# Patient Record
Sex: Female | Born: 1985 | Race: White | Hispanic: No | Marital: Married | Smoking: Never smoker
Health system: Southern US, Community
[De-identification: ages and names within clinical notes are randomized; demographics above are authoritative.]

## PROBLEM LIST (undated history)

## (undated) ENCOUNTER — Inpatient Hospital Stay (HOSPITAL_COMMUNITY): Payer: Self-pay

## (undated) DIAGNOSIS — Z789 Other specified health status: Secondary | ICD-10-CM

## (undated) HISTORY — PX: NO PAST SURGERIES: SHX2092

## (undated) HISTORY — PX: WISDOM TOOTH EXTRACTION: SHX21

---
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
## 2003-01-31 ENCOUNTER — Other Ambulatory Visit: Admission: RE | Admit: 2003-01-31 | Discharge: 2003-01-31 | Payer: Self-pay | Admitting: Family Medicine

## 2003-07-31 ENCOUNTER — Other Ambulatory Visit: Admission: RE | Admit: 2003-07-31 | Discharge: 2003-07-31 | Payer: Self-pay | Admitting: Family Medicine

## 2003-12-05 ENCOUNTER — Other Ambulatory Visit: Admission: RE | Admit: 2003-12-05 | Discharge: 2003-12-05 | Payer: Self-pay | Admitting: Family Medicine

## 2010-11-03 NOTE — L&D Delivery Note (Signed)
Patient was C/C/+2 and pushed for 45 minutes with epidural.   NSVD  female infant, Apgars 9,9, weight 6#14.   The patient had one MLE lacerations repaired with 2-0 vicryl R. Fundus was firm. EBL was expected. Placenta was delivered intact. Vagina was clear.  Baby was vigorous to bedside.  Rogue Rafalski A    

## 2010-12-05 LAB — RPR: RPR: NONREACTIVE

## 2010-12-05 LAB — ABO/RH: RH Type: POSITIVE

## 2011-06-30 LAB — STREP B DNA PROBE: GBS: NEGATIVE

## 2011-07-27 ENCOUNTER — Encounter (HOSPITAL_COMMUNITY): Payer: Self-pay | Admitting: Anesthesiology

## 2011-07-27 ENCOUNTER — Inpatient Hospital Stay (HOSPITAL_COMMUNITY)
Admission: AD | Admit: 2011-07-27 | Discharge: 2011-07-30 | DRG: 373 | Disposition: A | Discharge status: Home / Self Care | Payer: BC Managed Care – PPO | Source: Ambulatory Visit | Attending: Obstetrics and Gynecology | Admitting: Obstetrics and Gynecology

## 2011-07-27 ENCOUNTER — Encounter (HOSPITAL_COMMUNITY): Payer: Self-pay | Admitting: *Deleted

## 2011-07-27 ENCOUNTER — Inpatient Hospital Stay (HOSPITAL_COMMUNITY): Payer: BC Managed Care – PPO | Admitting: Anesthesiology

## 2011-07-27 DIAGNOSIS — Z3480 Encounter for supervision of other normal pregnancy, unspecified trimester: Secondary | ICD-10-CM

## 2011-07-27 HISTORY — DX: Other specified health status: Z78.9

## 2011-07-27 LAB — CBC
HCT: 34.2 % — ABNORMAL LOW (ref 36.0–46.0)
MCHC: 34.8 g/dL (ref 30.0–36.0)
MCV: 86.8 fL (ref 78.0–100.0)
Platelets: 156 10*3/uL (ref 150–400)
RDW: 12.7 % (ref 11.5–15.5)
WBC: 9.4 10*3/uL (ref 4.0–10.5)

## 2011-07-27 LAB — URINALYSIS, ROUTINE W REFLEX MICROSCOPIC
Glucose, UA: NEGATIVE mg/dL
Hgb urine dipstick: NEGATIVE
pH: 6 (ref 5.0–8.0)

## 2011-07-27 LAB — COMPREHENSIVE METABOLIC PANEL
AST: 19 U/L (ref 0–37)
Albumin: 2.9 g/dL — ABNORMAL LOW (ref 3.5–5.2)
BUN: 8 mg/dL (ref 6–23)
Creatinine, Ser: 0.47 mg/dL — ABNORMAL LOW (ref 0.50–1.10)
Potassium: 3.4 mEq/L — ABNORMAL LOW (ref 3.5–5.1)
Total Protein: 6.7 g/dL (ref 6.0–8.3)

## 2011-07-27 LAB — URINE MICROSCOPIC-ADD ON

## 2011-07-27 LAB — POCT FERN TEST: Fern Test: POSITIVE

## 2011-07-27 LAB — RPR: RPR Ser Ql: NONREACTIVE

## 2011-07-27 MED ORDER — DIPHENHYDRAMINE HCL 50 MG/ML IJ SOLN: 12.5000 mg | INTRAMUSCULAR | Status: DC | PRN

## 2011-07-27 MED ORDER — PHENYLEPHRINE 40 MCG/ML (10ML) SYRINGE FOR IV PUSH (FOR BLOOD PRESSURE SUPPORT)
80.0000 ug | PREFILLED_SYRINGE | INTRAVENOUS | Status: DC | PRN
  Filled 2011-07-27: qty 5

## 2011-07-27 MED ORDER — OXYTOCIN BOLUS FROM INFUSION
500.0000 mL | Freq: Once | INTRAVENOUS | Status: DC
  Filled 2011-07-27: qty 500

## 2011-07-27 MED ORDER — LACTATED RINGERS IV SOLN
500.0000 mL | Freq: Once | INTRAVENOUS | Status: AC
  Administered 2011-07-27: 500 mL via INTRAVENOUS

## 2011-07-27 MED ORDER — OXYCODONE-ACETAMINOPHEN 5-325 MG PO TABS: 2.0000 | ORAL_TABLET | ORAL | Status: DC | PRN

## 2011-07-27 MED ORDER — LIDOCAINE HCL 1.5 % IJ SOLN
INTRAMUSCULAR | Status: DC | PRN
  Administered 2011-07-27 (×2): 4 mL via EPIDURAL

## 2011-07-27 MED ORDER — EPHEDRINE 5 MG/ML INJ
10.0000 mg | INTRAVENOUS | Status: DC | PRN
  Filled 2011-07-27: qty 4

## 2011-07-27 MED ORDER — IBUPROFEN 600 MG PO TABS
600.0000 mg | ORAL_TABLET | Freq: Four times a day (QID) | ORAL | Status: DC | PRN
  Administered 2011-07-28: 600 mg via ORAL
  Filled 2011-07-27: qty 1

## 2011-07-27 MED ORDER — ONDANSETRON HCL 4 MG/2ML IJ SOLN: 4.0000 mg | Freq: Four times a day (QID) | INTRAMUSCULAR | Status: DC | PRN

## 2011-07-27 MED ORDER — OXYTOCIN 20 UNITS IN LACTATED RINGERS INFUSION - SIMPLE
1.0000 m[IU]/min | INTRAVENOUS | Status: DC
  Administered 2011-07-27: 2 m[IU]/min via INTRAVENOUS
  Filled 2011-07-27: qty 1000

## 2011-07-27 MED ORDER — ACETAMINOPHEN 325 MG PO TABS: 650.0000 mg | ORAL_TABLET | ORAL | Status: DC | PRN

## 2011-07-27 MED ORDER — PHENYLEPHRINE 40 MCG/ML (10ML) SYRINGE FOR IV PUSH (FOR BLOOD PRESSURE SUPPORT)
80.0000 ug | PREFILLED_SYRINGE | INTRAVENOUS | Status: DC | PRN
  Filled 2011-07-27 (×2): qty 5

## 2011-07-27 MED ORDER — FENTANYL 2.5 MCG/ML BUPIVACAINE 1/10 % EPIDURAL INFUSION (WH - ANES)
14.0000 mL/h | INTRAMUSCULAR | Status: DC
  Administered 2011-07-27 (×3): 14 mL/h via EPIDURAL
  Filled 2011-07-27 (×4): qty 60

## 2011-07-27 MED ORDER — OXYTOCIN 20 UNITS IN LACTATED RINGERS INFUSION - SIMPLE
125.0000 mL/h | Freq: Once | INTRAVENOUS | Status: AC
  Administered 2011-07-28: 125 mL/h via INTRAVENOUS

## 2011-07-27 MED ORDER — FLEET ENEMA 7-19 GM/118ML RE ENEM: 1.0000 | ENEMA | RECTAL | Status: DC | PRN

## 2011-07-27 MED ORDER — LIDOCAINE HCL (PF) 1 % IJ SOLN
30.0000 mL | INTRAMUSCULAR | Status: DC | PRN
  Filled 2011-07-27 (×2): qty 30

## 2011-07-27 MED ORDER — LACTATED RINGERS IV SOLN: 500.0000 mL | INTRAVENOUS | Status: DC | PRN

## 2011-07-27 MED ORDER — FENTANYL 2.5 MCG/ML BUPIVACAINE 1/10 % EPIDURAL INFUSION (WH - ANES)
INTRAMUSCULAR | Status: DC | PRN
  Administered 2011-07-27: 14 mL/h via EPIDURAL

## 2011-07-27 MED ORDER — LACTATED RINGERS IV SOLN
INTRAVENOUS | Status: DC
  Administered 2011-07-27 (×3): via INTRAVENOUS

## 2011-07-27 MED ORDER — CITRIC ACID-SODIUM CITRATE 334-500 MG/5ML PO SOLN: 30.0000 mL | ORAL | Status: DC | PRN

## 2011-07-27 MED ORDER — EPHEDRINE 5 MG/ML INJ
10.0000 mg | INTRAVENOUS | Status: DC | PRN
  Filled 2011-07-27 (×2): qty 4

## 2011-07-27 MED ORDER — TERBUTALINE SULFATE 1 MG/ML IJ SOLN: 0.2500 mg | Freq: Once | INTRAMUSCULAR | Status: AC | PRN

## 2011-07-27 NOTE — ED Provider Notes (Signed)
History   Pt presents today c/o ctx that have gradually worsened through the night. She reports GFM. She denies vag dc, bleeding, fever, or any other sx at this time.  Chief Complaint  Patient presents with  . Contractions   HPI  OB History    Grav Para Term Preterm Abortions TAB SAB Ect Mult Living   1               Past Medical History  Diagnosis Date  . No pertinent past medical history     Past Surgical History  Procedure Date  . No past surgeries     No family history on file.  History  Substance Use Topics  . Smoking status: Never Smoker   . Smokeless tobacco: Never Used  . Alcohol Use: No    Allergies: No Known Allergies  Prescriptions prior to admission  Medication Sig Dispense Refill  . multivitamin (BARIATRIC VIT W/EXTRA C) CHEW Chew 2 tablets by mouth daily.          Review of Systems  Constitutional: Negative for fever.  Eyes: Negative for blurred vision.  Cardiovascular: Negative for chest pain.  Gastrointestinal: Positive for abdominal pain. Negative for nausea, vomiting, diarrhea and constipation.  Genitourinary: Negative for dysuria, urgency, frequency and hematuria.  Neurological: Negative for dizziness and headaches.  Psychiatric/Behavioral: Negative for depression and suicidal ideas.   Physical Exam   Blood pressure 129/89, pulse 92, temperature 97.9 F (36.6 C), temperature source Oral, resp. rate 20, height 5\' 3"  (1.6 m), weight 157 lb 6.4 oz (71.396 kg).  Physical Exam  Constitutional: She is oriented to person, place, and time. She appears well-developed and well-nourished. No distress.  HENT:  Head: Normocephalic and atraumatic.  Eyes: EOM are normal. Pupils are equal, round, and reactive to light.  GI: Soft. She exhibits no distension. There is no tenderness. There is no rebound and no guarding.  Genitourinary:       Cervix per nurse 1-2/90/-1.  Neurological: She is alert and oriented to person, place, and time.  Skin: Skin is  warm and dry. She is not diaphoretic.  Psychiatric: She has a normal mood and affect. Her behavior is normal. Judgment and thought content normal.    MAU Course  Procedures  CBC, CMET, UA ordered.  Care of pt transferred to Eveline Keto, FNP.  Assessment and Plan PIH labs back, normal, B/P's still sl elevated. Pt continues to contract, is uncomfortable, reactive strip. Cx unchanged, + ferning on slide. Dr Henderson Cloud notified by RN  Eveline Keto, NP    RICE,EASTON 07/27/2011, 7:47 AM   Henrietta Hoover, PA 07/27/11 7829  [place]. Desarie Feild 07/27/11 [phone]

## 2011-07-27 NOTE — H&P (Signed)
25 y.o. 31  G1P0 comes in c/o labor.  Otherwise has good fetal movement and no bleeding.  SROM in MAU.  No s/s preeclampsia.  Past Medical History  Diagnosis Date  . No pertinent past medical history     Past Surgical History  Procedure Date  . No past surgeries     OB History    Grav Para Term Preterm Abortions TAB SAB Ect Mult Living   1              # Outc Date GA Lbr Len/2nd Wgt Sex Del Anes PTL Lv   1 CUR               History   Social History  . Marital Status: Married    Spouse Name: N/A    Number of Children: N/A  . Years of Education: N/A   Occupational History  . Not on file.   Social History Main Topics  . Smoking status: Never Smoker   . Smokeless tobacco: Never Used  . Alcohol Use: No  . Drug Use: No  . Sexually Active: Yes   Other Topics Concern  . Not on file   Social History Narrative  . No narrative on file   Review of patient's allergies indicates no known allergies.   Prenatal Course:  Uncomplicated.  Filed Vitals:   07/27/11 0847  BP: 137/87  Pulse: 88  Temp:   Resp:      Lungs/Cor:  NAD Abdomen:  soft, gravid Ex:  no cords, erythema SVE:  1./90/-1 grossly SROM per nurse. FHTs:  140s, good STV, NST R Toco:  q3-6   A/P   SROM/Labor.    GBS neg.  Katrece Roediger A

## 2011-07-27 NOTE — Progress Notes (Signed)
G1 at 39.1wks. Cramping and contractions since Sat pm. Ctxs have gradually gotten closer and stronger.

## 2011-07-27 NOTE — Progress Notes (Signed)
Notified of SVE, elevated b/ps PIH lab results and positive SROM. Orders to admit pt to L&D

## 2011-07-27 NOTE — Anesthesia Procedure Notes (Signed)
Epidural Patient location during procedure: OB Start time: 07/27/2011 11:59 AM  Staffing Anesthesiologist: Kahealani Yankovich A. Performed by: anesthesiologist   Preanesthetic Checklist Completed: patient identified, site marked, surgical consent, pre-op evaluation, timeout performed, IV checked, risks and benefits discussed and monitors and equipment checked  Epidural Patient position: sitting Prep: site prepped and draped and DuraPrep Patient monitoring: continuous pulse ox and blood pressure Approach: midline Injection technique: LOR air  Needle:  Needle type: Tuohy  Needle gauge: 17 G Needle length: 9 cm Needle insertion depth: 5 cm cm Catheter type: closed end flexible Catheter size: 19 Gauge Catheter at skin depth: 10 cm Test dose: negative and 1.5% lidocaine  Assessment Events: blood not aspirated, injection not painful, no injection resistance, negative IV test and no paresthesia  Additional Notes Patient is more comfortable after epidural dosed. Please see RN's note for documentation of vital signs and FHR which are stable.

## 2011-07-27 NOTE — Anesthesia Preprocedure Evaluation (Signed)
Anesthesia Evaluation  Name, MR# and DOB Patient awake  General Assessment Comment  Reviewed: Allergy & Precautions, H&P , Patient's Chart, lab work & pertinent test results  Airway Mallampati: III TM Distance: >3 FB Neck ROM: full    Dental No notable dental hx. (+) Teeth Intact   Pulmonary  clear to auscultation  pulmonary exam normalPulmonary Exam Normal breath sounds clear to auscultation none    Cardiovascular regular Normal    Neuro/Psych Negative Neurological ROS  Negative Psych ROS  GI/Hepatic/Renal negative GI ROS  negative Liver ROS  negative Renal ROS        Endo/Other  Negative Endocrine ROS (+)      Abdominal   Musculoskeletal   Hematology negative hematology ROS (+)   Peds  Reproductive/Obstetrics (+) Pregnancy    Anesthesia Other Findings             Anesthesia Physical Anesthesia Plan  ASA: II  Anesthesia Plan: Epidural   Post-op Pain Management:    Induction:   Airway Management Planned:   Additional Equipment:   Intra-op Plan:   Post-operative Plan:   Informed Consent: I have reviewed the patients History and Physical, chart, labs and discussed the procedure including the risks, benefits and alternatives for the proposed anesthesia with the patient or authorized representative who has indicated his/her understanding and acceptance.     Plan Discussed with: Anesthesiologist  Anesthesia Plan Comments:         Anesthesia Quick Evaluation  

## 2011-07-27 NOTE — ED Notes (Signed)
Henrietta Hoover PA notified of pt's admission for labor ck. And elevated B/Ps . Will see pt

## 2011-07-28 ENCOUNTER — Encounter (HOSPITAL_COMMUNITY): Payer: Self-pay | Admitting: *Deleted

## 2011-07-28 LAB — CBC
HCT: 28.6 % — ABNORMAL LOW (ref 36.0–46.0)
Hemoglobin: 10 g/dL — ABNORMAL LOW (ref 12.0–15.0)
MCH: 30.6 pg (ref 26.0–34.0)
MCHC: 35 g/dL (ref 30.0–36.0)
MCV: 87.5 fL (ref 78.0–100.0)
RBC: 3.27 MIL/uL — ABNORMAL LOW (ref 3.87–5.11)

## 2011-07-28 LAB — ABO/RH: ABO/RH(D): O POS

## 2011-07-28 MED ORDER — ONDANSETRON HCL 4 MG/2ML IJ SOLN: 4.0000 mg | INTRAMUSCULAR | Status: DC | PRN

## 2011-07-28 MED ORDER — BENZOCAINE-MENTHOL 20-0.5 % EX AERO
1.0000 "application " | INHALATION_SPRAY | CUTANEOUS | Status: DC | PRN
  Administered 2011-07-28: 1 via TOPICAL

## 2011-07-28 MED ORDER — MEASLES, MUMPS & RUBELLA VAC ~~LOC~~ INJ
0.5000 mL | INJECTION | Freq: Once | SUBCUTANEOUS | Status: DC
Start: 2011-07-29 — End: 2011-07-29
  Filled 2011-07-28: qty 0.5

## 2011-07-28 MED ORDER — ZOLPIDEM TARTRATE 5 MG PO TABS: 5.0000 mg | ORAL_TABLET | Freq: Every evening | ORAL | Status: DC | PRN

## 2011-07-28 MED ORDER — SODIUM CHLORIDE 0.9 % IJ SOLN: 3.0000 mL | INTRAMUSCULAR | Status: DC | PRN

## 2011-07-28 MED ORDER — SODIUM CHLORIDE 0.9 % IV SOLN: 250.0000 mL | INTRAVENOUS | Status: DC

## 2011-07-28 MED ORDER — MEASLES, MUMPS & RUBELLA VAC ~~LOC~~ INJ: 0.5000 mL | INJECTION | Freq: Once | SUBCUTANEOUS | Status: DC

## 2011-07-28 MED ORDER — MAGNESIUM HYDROXIDE 400 MG/5ML PO SUSP: 30.0000 mL | ORAL | Status: DC | PRN

## 2011-07-28 MED ORDER — METHYLERGONOVINE MALEATE 0.2 MG PO TABS: 0.2000 mg | ORAL_TABLET | ORAL | Status: DC | PRN

## 2011-07-28 MED ORDER — OXYCODONE-ACETAMINOPHEN 5-325 MG PO TABS: 1.0000 | ORAL_TABLET | ORAL | Status: DC | PRN

## 2011-07-28 MED ORDER — ONDANSETRON HCL 4 MG PO TABS: 4.0000 mg | ORAL_TABLET | ORAL | Status: DC | PRN

## 2011-07-28 MED ORDER — PRENATAL PLUS 27-1 MG PO TABS: 1.0000 | ORAL_TABLET | Freq: Every day | ORAL | Status: DC

## 2011-07-28 MED ORDER — METHYLERGONOVINE MALEATE 0.2 MG/ML IJ SOLN: 0.2000 mg | INTRAMUSCULAR | Status: DC | PRN

## 2011-07-28 MED ORDER — DIPHENHYDRAMINE HCL 25 MG PO CAPS: 25.0000 mg | ORAL_CAPSULE | Freq: Four times a day (QID) | ORAL | Status: DC | PRN

## 2011-07-28 MED ORDER — SIMETHICONE 80 MG PO CHEW: 80.0000 mg | CHEWABLE_TABLET | ORAL | Status: DC | PRN

## 2011-07-28 MED ORDER — WITCH HAZEL-GLYCERIN EX PADS
1.0000 "application " | MEDICATED_PAD | CUTANEOUS | Status: DC | PRN
  Administered 2011-07-28: 1 via TOPICAL

## 2011-07-28 MED ORDER — TETANUS-DIPHTH-ACELL PERTUSSIS 5-2.5-18.5 LF-MCG/0.5 IM SUSP: 0.5000 mL | Freq: Once | INTRAMUSCULAR | Status: DC

## 2011-07-28 MED ORDER — WITCH HAZEL-GLYCERIN EX PADS: 1.0000 "application " | MEDICATED_PAD | CUTANEOUS | Status: DC | PRN

## 2011-07-28 MED ORDER — DIBUCAINE 1 % RE OINT
1.0000 "application " | TOPICAL_OINTMENT | RECTAL | Status: DC | PRN
  Administered 2011-07-28: 1 via RECTAL
  Filled 2011-07-28: qty 28

## 2011-07-28 MED ORDER — PRENATAL PLUS 27-1 MG PO TABS
1.0000 | ORAL_TABLET | Freq: Every day | ORAL | Status: DC
  Administered 2011-07-28 – 2011-07-30 (×3): 1 via ORAL
  Filled 2011-07-28 (×3): qty 1

## 2011-07-28 MED ORDER — OXYTOCIN 20 UNITS IN LACTATED RINGERS INFUSION - SIMPLE: 125.0000 mL/h | INTRAVENOUS | Status: DC | PRN

## 2011-07-28 MED ORDER — SENNOSIDES-DOCUSATE SODIUM 8.6-50 MG PO TABS: 2.0000 | ORAL_TABLET | Freq: Every day | ORAL | Status: DC

## 2011-07-28 MED ORDER — LANOLIN HYDROUS EX OINT
TOPICAL_OINTMENT | CUTANEOUS | Status: DC | PRN
Start: 2011-07-28 — End: 2011-07-28

## 2011-07-28 MED ORDER — FERROUS SULFATE 325 (65 FE) MG PO TABS
325.0000 mg | ORAL_TABLET | Freq: Two times a day (BID) | ORAL | Status: DC
  Administered 2011-07-28: 325 mg via ORAL

## 2011-07-28 MED ORDER — IBUPROFEN 800 MG PO TABS
800.0000 mg | ORAL_TABLET | Freq: Three times a day (TID) | ORAL | Status: DC
  Administered 2011-07-28 – 2011-07-30 (×6): 800 mg via ORAL
  Filled 2011-07-28 (×6): qty 1

## 2011-07-28 MED ORDER — FERROUS SULFATE 325 (65 FE) MG PO TABS
325.0000 mg | ORAL_TABLET | Freq: Two times a day (BID) | ORAL | Status: DC
  Administered 2011-07-28 – 2011-07-30 (×4): 325 mg via ORAL
  Filled 2011-07-28 (×5): qty 1

## 2011-07-28 MED ORDER — SENNOSIDES-DOCUSATE SODIUM 8.6-50 MG PO TABS
2.0000 | ORAL_TABLET | Freq: Every day | ORAL | Status: DC
  Administered 2011-07-28 – 2011-07-29 (×2): 2 via ORAL

## 2011-07-28 MED ORDER — LANOLIN HYDROUS EX OINT: TOPICAL_OINTMENT | CUTANEOUS | Status: DC | PRN

## 2011-07-28 MED ORDER — BENZOCAINE-MENTHOL 20-0.5 % EX AERO: 1.0000 "application " | INHALATION_SPRAY | CUTANEOUS | Status: DC | PRN

## 2011-07-28 MED ORDER — SODIUM CHLORIDE 0.9 % IJ SOLN: 3.0000 mL | Freq: Two times a day (BID) | INTRAMUSCULAR | Status: DC

## 2011-07-28 MED ORDER — IBUPROFEN 800 MG PO TABS: 800.0000 mg | ORAL_TABLET | Freq: Three times a day (TID) | ORAL | Status: DC

## 2011-07-28 MED ORDER — DIBUCAINE 1 % RE OINT: 1.0000 "application " | TOPICAL_OINTMENT | RECTAL | Status: DC | PRN

## 2011-07-28 MED ORDER — BENZOCAINE-MENTHOL 20-0.5 % EX AERO
INHALATION_SPRAY | CUTANEOUS | Status: AC
  Administered 2011-07-28: 1 via TOPICAL
  Filled 2011-07-28: qty 56

## 2011-07-28 NOTE — Plan of Care (Signed)
Problem: Discharge Progression Outcomes Goal: Complications resolved/controlled Outcome: Completed/Met Date Met:  07/28/11 hemmorroid tucks and dibucaine issued needs sitz bath tomorrow Goal: Discharge plan in place and appropriate Outcome: Completed/Met Date Met:  07/28/11 Wants early d/c

## 2011-07-28 NOTE — Progress Notes (Signed)
Post Partum Day 1 Subjective: no complaints, up ad lib, voiding, tolerating PO and + flatus  Objective: Blood pressure 131/84, pulse 77, temperature 98.1 F (36.7 C), temperature source Oral, resp. rate 18, height 5\' 3"  (1.6 m), weight 71.396 kg (157 lb 6.4 oz), SpO2 98.00%, unknown if currently breastfeeding.  Physical Exam:  General: alert, cooperative, appears stated age and no distress Lochia: appropriate Uterine Fundus: firm DVT Evaluation: No evidence of DVT seen on physical exam.   Basename 07/28/11 0508 07/27/11 0732  HGB 10.0* 11.9*  HCT 28.6* 34.2*    Assessment/Plan: Routine Care Breastfeeding   LOS: 1 day   Christia Domke H. 07/28/2011, 9:35 AM

## 2011-07-28 NOTE — Anesthesia Postprocedure Evaluation (Signed)
  Anesthesia Post-op Note  Patient: Sport and exercise psychologist  Procedure(s) Performed: * No procedures listed *  Patient Location: PACU and Mother/Baby  Anesthesia Type: Epidural  Level of Consciousness: awake, alert , oriented, patient cooperative and responds to stimulation  Airway and Oxygen Therapy: Patient Spontanous Breathing  Post-op Pain: none  Post-op Assessment: Post-op Vital signs reviewed, Patient's Cardiovascular Status Stable, Respiratory Function Stable, Patent Airway, No signs of Nausea or vomiting and Pain level controlled  Post-op Vital Signs: Reviewed and stable  Complications: No apparent anesthesia complications

## 2011-07-28 NOTE — Progress Notes (Signed)
Patient was C/C/+2 and pushed for 45 minutes with epidural.   NSVD  female infant, Apgars 9,9, weight 6#14.   The patient had one MLE lacerations repaired with 2-0 vicryl R. Fundus was firm. EBL was expected. Placenta was delivered intact. Vagina was clear.  Baby was vigorous to bedside.  Natasha Duke A

## 2011-07-29 LAB — CBC
HCT: 26.4 % — ABNORMAL LOW (ref 36.0–46.0)
MCH: 30.6 pg (ref 26.0–34.0)
MCHC: 35.2 g/dL (ref 30.0–36.0)
MCV: 86.8 fL (ref 78.0–100.0)
Platelets: 131 10*3/uL — ABNORMAL LOW (ref 150–400)
RDW: 13 % (ref 11.5–15.5)
WBC: 12.3 10*3/uL — ABNORMAL HIGH (ref 4.0–10.5)

## 2011-07-29 NOTE — Progress Notes (Signed)
Post Partum Day 1 Subjective: no complaints  Objective: Blood pressure 114/72, pulse 108, temperature 99.1 F (37.3 C), temperature source Oral, resp. rate 18, height 5\' 3"  (1.6 m), weight 71.396 kg (157 lb 6.4 oz), SpO2 97.00%, unknown if currently breastfeeding.  Physical Exam:  General: alert Lochia: appropriate Uterine Fundus: firm   Basename 07/29/11 0030 07/28/11 0508  HGB 9.3* 10.0*  HCT 26.4* 28.6*    Assessment/Plan: Plan for discharge tomorrow, baby not nursing well, pt. declined early discharge.   LOS: 2 days   Adonai Selsor D 07/29/2011, 8:40 AM

## 2011-07-30 NOTE — Progress Notes (Signed)
PPD#2 Pt without c/o. VVSAF IMP/ doing well. PLAN/ d/c

## 2011-07-30 NOTE — Discharge Summary (Signed)
  Pt was admitted with SROM.  Labor course was uneventful.  She had a SVD over second degree ML tear. PP course was uneventful. She was discharged to home on day 2.

## 2011-08-03 ENCOUNTER — Inpatient Hospital Stay (HOSPITAL_COMMUNITY): Admission: AD | Admit: 2011-08-03 | Payer: Self-pay | Source: Ambulatory Visit | Admitting: Obstetrics and Gynecology

## 2011-08-05 ENCOUNTER — Ambulatory Visit (HOSPITAL_COMMUNITY)
Admit: 2011-08-05 | Discharge: 2011-08-05 | Disposition: A | Discharge status: Home / Self Care | Payer: BC Managed Care – PPO | Attending: Obstetrics and Gynecology | Admitting: Obstetrics and Gynecology

## 2011-08-05 NOTE — Progress Notes (Signed)
Infant Lactation Consultation Outpatient Visit Note  Patient Name: Natasha Duke Date of Birth: 12-28-1985 Birth Weight:  6-13 Gestational Age at Delivery: Gestational Age: <None> Type of Delivery: vaginal  Breastfeeding History Frequency of Breastfeeding: every 3 hrs day, 3-4 hrs at night Length of Feeding: 30 mins Voids: 8 times/24 hrs Stools: 8 times/24 hrs  Supplementing / Method: Pumping: none at this time  Type of Pump:   Frequency:  Volume:    Comments: Baby has been solely breastfeeding, no supplements, Mom uses a nipple shield to assist with a proper latch.  Breasts are full, and large, nipples erect, baby opens widely but quickly slips too shallow onto nipple.  Using the 24 mm nipple shield helps baby maintain a deeper latch.  Baby has gained 14 oz. In 6 days!  Mom is reassured that use of the nipple shield is helping baby transfer milk, so not to be rushed to try to wean.  Tips given on how to wean off of nipple shield.     Consultation Evaluation:  Initial Feeding Assessment: Pre-feed Weight:3270 Post-feed [id number]:8119 Amount Transferred:47 Comments: left breast with nipple shield  Additional Feeding Assessment: Pre-feed Weight:3317 Post-feed JYNWGN:5621 Amount Transferred:30 Comments: changed to smaller nipple shield (20mm)   Total Breast milk Transferred this Visit: 77 ml Total Supplement Given: 0  Additional Interventions: Gave Mom a smaller size nipple shield (20mm) as the larger looked too large which can encourage nipple pinching if not positioned properly on nipple..  Baby latched deeply and nursed well with 20 mm nipple shield. Mom to call us prn for any questions.  Follow-Up Mom to continue to nurse on cue, and call us for any questions she may have.     Judee Clara 08/05/2011, 10:46 AM

## 2013-06-10 ENCOUNTER — Other Ambulatory Visit (HOSPITAL_COMMUNITY): Payer: Self-pay | Admitting: Obstetrics and Gynecology

## 2013-06-10 DIAGNOSIS — O2620 Pregnancy care for patient with recurrent pregnancy loss, unspecified trimester: Secondary | ICD-10-CM

## 2013-06-13 ENCOUNTER — Ambulatory Visit (HOSPITAL_COMMUNITY)
Admission: RE | Admit: 2013-06-13 | Discharge: 2013-06-13 | Disposition: A | Discharge status: Home / Self Care | Payer: BC Managed Care – PPO | Source: Ambulatory Visit | Attending: Obstetrics and Gynecology | Admitting: Obstetrics and Gynecology

## 2013-06-13 DIAGNOSIS — O2620 Pregnancy care for patient with recurrent pregnancy loss, unspecified trimester: Secondary | ICD-10-CM

## 2013-06-13 DIAGNOSIS — N96 Recurrent pregnancy loss: Secondary | ICD-10-CM | POA: Insufficient documentation

## 2013-06-13 DIAGNOSIS — N854 Malposition of uterus: Secondary | ICD-10-CM | POA: Insufficient documentation

## 2013-06-13 MED ORDER — IOHEXOL 300 MG/ML  SOLN
20.0000 mL | Freq: Once | INTRAMUSCULAR | Status: AC | PRN
  Administered 2013-06-13: 18 mL

## 2013-07-31 ENCOUNTER — Inpatient Hospital Stay (HOSPITAL_COMMUNITY)
Admission: AD | Admit: 2013-07-31 | Discharge: 2013-07-31 | Disposition: A | Discharge status: Home / Self Care | Payer: BC Managed Care – PPO | Source: Ambulatory Visit | Attending: Obstetrics and Gynecology | Admitting: Obstetrics and Gynecology

## 2013-07-31 ENCOUNTER — Encounter (HOSPITAL_COMMUNITY): Payer: Self-pay | Admitting: *Deleted

## 2013-07-31 ENCOUNTER — Inpatient Hospital Stay (HOSPITAL_COMMUNITY): Payer: BC Managed Care – PPO

## 2013-07-31 DIAGNOSIS — O209 Hemorrhage in early pregnancy, unspecified: Secondary | ICD-10-CM

## 2013-07-31 DIAGNOSIS — O4690 Antepartum hemorrhage, unspecified, unspecified trimester: Secondary | ICD-10-CM

## 2013-07-31 DIAGNOSIS — O26859 Spotting complicating pregnancy, unspecified trimester: Secondary | ICD-10-CM | POA: Insufficient documentation

## 2013-07-31 LAB — CBC
HCT: 35.1 % — ABNORMAL LOW (ref 36.0–46.0)
MCV: 80.7 fL (ref 78.0–100.0)
Platelets: 199 10*3/uL (ref 150–400)
RBC: 4.35 MIL/uL (ref 3.87–5.11)
WBC: 5.8 10*3/uL (ref 4.0–10.5)

## 2013-07-31 LAB — WET PREP, GENITAL
Clue Cells Wet Prep HPF POC: NONE SEEN
Trich, Wet Prep: NONE SEEN

## 2013-07-31 LAB — URINALYSIS, ROUTINE W REFLEX MICROSCOPIC
Glucose, UA: NEGATIVE mg/dL
Leukocytes, UA: NEGATIVE
Protein, ur: NEGATIVE mg/dL
pH: 6.5 (ref 5.0–8.0)

## 2013-07-31 LAB — HCG, QUANTITATIVE, PREGNANCY: hCG, Beta Chain, Quant, S: 51420 m[IU]/mL — ABNORMAL HIGH (ref <5)

## 2013-07-31 LAB — OB RESULTS CONSOLE GC/CHLAMYDIA
Chlamydia: NEGATIVE
Gonorrhea: NEGATIVE

## 2013-07-31 LAB — ABO/RH: ABO/RH(D): O POS

## 2013-07-31 LAB — POCT PREGNANCY, URINE: Preg Test, Ur: POSITIVE — AB

## 2013-07-31 MED ORDER — DOXYLAMINE-PYRIDOXINE 10-10 MG PO TBEC: 2.0000 | DELAYED_RELEASE_TABLET | Freq: Every day | ORAL | Status: DC

## 2013-07-31 NOTE — MAU Provider Note (Signed)
History     CSN: [id number]  Arrival date and time: 07/31/13 1101   None     Chief Complaint  Patient presents with  . Vaginal Bleeding   HPI This is a 27 y.o. female at 7.2wks by LMP who presents with c/o spotting this morning. No heavy flow. Slight cramping. History is remarkable for SVD in 2012 with reported 3 SABs since January of this year. Did not seek medical care for any of them. All were under 10 weeks. Was tested for recurrent SAB. Had HSG a week before conception.  RN Note:  Pt presents with complaints of bright red vaginal bleeding since this morning, states it is only when she wipes. She does have a history of 3 SAB in the past.       OB History   Grav Para Term Preterm Abortions TAB SAB Ect Mult Living   5 1 1  0 3 0 3 0 0 1      Past Medical History  Diagnosis Date  . No pertinent past medical history     Past Surgical History  Procedure Laterality Date  . No past surgeries      No family history on file.  History  Substance Use Topics  . Smoking status: Never Smoker   . Smokeless tobacco: Never Used  . Alcohol Use: No    Allergies: No Known Allergies  Prescriptions prior to admission  Medication Sig Dispense Refill  . multivitamin (BARIATRIC VIT W/EXTRA C) CHEW Chew 2 tablets by mouth daily.          Review of Systems  Constitutional: Negative for fever, chills and malaise/fatigue.  Gastrointestinal: Positive for nausea and abdominal pain (mild cramps, spotting). Negative for vomiting, diarrhea and constipation.  Neurological: Negative for dizziness.   Physical Exam   Blood pressure 155/97, pulse 113, temperature 98 F (36.7 C), temperature source Oral, resp. rate 16, height 5\' 2"  (1.575 m), weight 53.978 kg (119 lb), last menstrual period 06/07/2013.  Physical Exam  Constitutional: She is oriented to person, place, and time. She appears well-developed and well-nourished. No distress (tearful).  Cardiovascular: Normal rate.    Respiratory: Effort normal.  GI: Soft. She exhibits no distension and no mass. There is no tenderness. There is no rebound and no guarding.  Genitourinary: Uterus normal. Vaginal discharge (creamy white, no colored discharge, cervix long and closed) found.  Musculoskeletal: Normal range of motion.  Neurological: She is alert and oriented to person, place, and time.  Skin: Skin is warm and dry.  Psychiatric: She has a normal mood and affect.    MAU Course  Procedures  MDM Results for orders placed during the hospital encounter of 07/31/13 (from the past 72 hour(s))  URINALYSIS, ROUTINE W REFLEX MICROSCOPIC     Status: Abnormal   Collection Time    07/31/13 11:05 AM      Result Value Range   Color, Urine YELLOW  YELLOW   APPearance CLEAR  CLEAR   Specific Gravity, Urine >1.030 (*) 1.005 - 1.030   pH 6.5  5.0 - 8.0   Glucose, UA NEGATIVE  NEGATIVE mg/dL   Hgb urine dipstick NEGATIVE  NEGATIVE   Bilirubin Urine NEGATIVE  NEGATIVE   Ketones, ur NEGATIVE  NEGATIVE mg/dL   Protein, ur NEGATIVE  NEGATIVE mg/dL   Urobilinogen, UA 0.2  0.0 - 1.0 mg/dL   Nitrite NEGATIVE  NEGATIVE   Leukocytes, UA NEGATIVE  NEGATIVE   Comment: MICROSCOPIC NOT DONE ON URINES WITH NEGATIVE PROTEIN,  BLOOD, LEUKOCYTES, NITRITE, OR GLUCOSE <1000 mg/dL.  POCT PREGNANCY, URINE     Status: Abnormal   Collection Time    07/31/13 11:18 AM      Result Value Range   Preg Test, Ur POSITIVE (*) NEGATIVE   Comment:            THE SENSITIVITY OF THIS     METHODOLOGY IS >24 mIU/mL  WET PREP, GENITAL     Status: Abnormal   Collection Time    07/31/13 11:38 AM      Result Value Range   Yeast Wet Prep HPF POC NONE SEEN  NONE SEEN   Trich, Wet Prep NONE SEEN  NONE SEEN   Clue Cells Wet Prep HPF POC NONE SEEN  NONE SEEN   WBC, Wet Prep HPF POC FEW (*) NONE SEEN   Comment: FEW BACTERIA SEEN  HCG, QUANTITATIVE, PREGNANCY     Status: Abnormal   Collection Time    07/31/13 11:43 AM      Result Value Range   hCG,  Beta Chain, Quant, S 51420 (*) <5 mIU/mL   Comment:              GEST. AGE      CONC.  (mIU/mL)       <=1 WEEK        5 - 50         2 WEEKS       50 - 500         3 WEEKS       100 - 10,000         4 WEEKS     1,000 - 30,000         5 WEEKS     3,500 - 115,000       6-8 WEEKS     12,000 - 270,000        12 WEEKS     15,000 - 220,000                FEMALE AND NON-PREGNANT FEMALE:         LESS THAN 5 mIU/mL  CBC     Status: Abnormal   Collection Time    07/31/13 11:43 AM      Result Value Range   WBC 5.8  4.0 - 10.5 K/uL   RBC 4.35  3.87 - 5.11 MIL/uL   Hemoglobin 12.8  12.0 - 15.0 g/dL   HCT 40.9 (*) 81.1 - 91.4 %   MCV 80.7  78.0 - 100.0 fL   MCH 29.4  26.0 - 34.0 pg   MCHC 36.5 (*) 30.0 - 36.0 g/dL   RDW 78.2  95.6 - 21.3 %   Platelets 199  150 - 400 K/uL  ABO/RH     Status: None   Collection Time    07/31/13 11:43 AM      Result Value Range   ABO/RH(D) O POS     US Ob Transvaginal  07/31/2013   CLINICAL DATA:  Cramping, spotting, three prior spontaneous abortions  EXAM: OBSTETRIC <14 WK [place] AND TRANSVAGINAL OB US  TECHNIQUE: Both transabdominal and transvaginal ultrasound examinations were performed for complete evaluation of the gestation as well as the maternal uterus, adnexal regions, and pelvic cul-de-sac. Transvaginal technique was performed to assess early pregnancy.  COMPARISON:  None  FINDINGS: Intrauterine gestational sac: Visualized/normal in shape.  Yolk sac:  Present  Embryo:  Present  Cardiac Activity: Present  Heart Rate:  135  bpm  CRL:   8.5  mm   6 w 6 d                  [place] EDC: 03/20/2014  Maternal uterus/adnexae:  Small subchorionic hemorrhage, 24 x 9 x 16 mm.  Uterus otherwise unremarkable.  Right ovary normal size and morphology 2.1 x 1.3 x 1.5 cm.  Left ovary normal size and morphology, 3.6 x 1.6 x 2.5 cm.  No adnexal masses or free pelvic fluid.  IMPRESSION: Single live early intrauterine gestational measured at 6 weeks 6 days EGA by crown-rump length.  Small  subchorionic hemorrhage.   Electronically Signed   By: Ulyses Southward M.D.   On: 07/31/2013 12:45    Assessment and Plan  A:  SIUP at 6.6 weeks      First trimester spotting      Small subchorionic hemorrhage  P:  Discussed with Dr Arlyce Dice      Discussed with pt.       Pelvic rest      Follow up with office as scheduled early October  Affie Gasner 07/31/2013, 11:16 AM

## 2013-07-31 NOTE — MAU Note (Signed)
Pt presents with complaints of bright red vaginal bleeding since this morning, states it is only when she wipes. She does have a history of 3 SAB in the past.

## 2013-07-31 NOTE — MAU Note (Signed)
Pt states she had some mild spotting this morning upon wiping after using the bathroom. No bleeding at this time.

## 2013-08-16 ENCOUNTER — Encounter (HOSPITAL_COMMUNITY): Payer: Self-pay | Admitting: *Deleted

## 2013-08-16 ENCOUNTER — Inpatient Hospital Stay (HOSPITAL_COMMUNITY)
Admission: AD | Admit: 2013-08-16 | Discharge: 2013-08-16 | Disposition: A | Discharge status: Home / Self Care | Payer: BC Managed Care – PPO | Source: Ambulatory Visit | Attending: Obstetrics and Gynecology | Admitting: Obstetrics and Gynecology

## 2013-08-16 DIAGNOSIS — K117 Disturbances of salivary secretion: Secondary | ICD-10-CM | POA: Insufficient documentation

## 2013-08-16 DIAGNOSIS — E860 Dehydration: Secondary | ICD-10-CM | POA: Insufficient documentation

## 2013-08-16 DIAGNOSIS — O210 Mild hyperemesis gravidarum: Secondary | ICD-10-CM | POA: Insufficient documentation

## 2013-08-16 DIAGNOSIS — O9989 Other specified diseases and conditions complicating pregnancy, childbirth and the puerperium: Secondary | ICD-10-CM | POA: Insufficient documentation

## 2013-08-16 DIAGNOSIS — O219 Vomiting of pregnancy, unspecified: Secondary | ICD-10-CM

## 2013-08-16 HISTORY — DX: Other specified health status: Z78.9

## 2013-08-16 LAB — URINALYSIS, ROUTINE W REFLEX MICROSCOPIC
Glucose, UA: NEGATIVE mg/dL
Hgb urine dipstick: NEGATIVE
Nitrite: NEGATIVE
Specific Gravity, Urine: >1.03 — ABNORMAL HIGH (ref 1.005–1.030)
Urobilinogen, UA: 0.2 mg/dL (ref 0.0–1.0)

## 2013-08-16 MED ORDER — LACTATED RINGERS IV BOLUS (SEPSIS)
1000.0000 mL | Freq: Once | INTRAVENOUS | Status: AC
  Administered 2013-08-16: 1000 mL via INTRAVENOUS

## 2013-08-16 MED ORDER — GLYCOPYRROLATE 2 MG PO TABS: 2.0000 mg | ORAL_TABLET | Freq: Three times a day (TID) | ORAL | Status: DC

## 2013-08-16 MED ORDER — PROMETHAZINE HCL 25 MG/ML IJ SOLN
25.0000 mg | Freq: Once | INTRAVENOUS | Status: DC
  Filled 2013-08-16: qty 1

## 2013-08-16 MED ORDER — ONDANSETRON 8 MG/NS 50 ML IVPB
8.0000 mg | Freq: Once | INTRAVENOUS | Status: AC
  Administered 2013-08-16: 8 mg via INTRAVENOUS
  Filled 2013-08-16: qty 8

## 2013-08-16 NOTE — MAU Provider Note (Signed)
Chief Complaint: Emesis During Pregnancy  First patient contact at 1915   SUBJECTIVE HPI: Natasha Duke is a 27 y.o. G5P1031 at [redacted]w[redacted]d by LMP who has vomited 4 times today and having ptyalism. Has eaten very little. Has tried Diclegis.  Past Medical History  Diagnosis Date  . No pertinent past medical history   . Medical history non-contributory    OB History  Gravida Para Term Preterm AB SAB TAB Ectopic Multiple Living  5 1 1  0 3 3 0 0 0 1    # Outcome Date GA Lbr Len/2nd Weight Sex Delivery Anes PTL Lv  5 CUR           4 SAB 2014          3 SAB 2014          2 SAB 2014          1 TRM 07/28/11 [redacted]w[redacted]d 27:30 / 00:47 3.11 kg (6 lb 13.7 oz) F SVD EPI  Y     Comments: none     Past Surgical History  Procedure Laterality Date  . Wisdom tooth extraction     History   Social History  . Marital Status: Married    Spouse Name: N/A    Number of Children: N/A  . Years of Education: N/A   Occupational History  . Not on file.   Social History Main Topics  . Smoking status: Never Smoker   . Smokeless tobacco: Never Used  . Alcohol Use: No  . Drug Use: No  . Sexual Activity: Yes   Other Topics Concern  . Not on file   Social History Narrative  . No narrative on file   No current facility-administered medications on file prior to encounter.   Current Outpatient Prescriptions on File Prior to Encounter  Medication Sig Dispense Refill  . Doxylamine-Pyridoxine (DICLEGIS) 10-10 MG TBEC Take 2 tablets by mouth at bedtime.  60 tablet  3  . Prenatal Vit-Fe Fumarate-FA (PRENATAL MULTIVITAMIN) TABS tablet Take 1 tablet by mouth daily at 12 noon.       No Known Allergies  ROS: Pertinent items in HPI  OBJECTIVE Blood pressure 131/85, temperature 98.5 F (36.9 C), temperature source Oral, resp. rate 16, height 5\' 2"  (1.575 m), weight 52.617 kg (116 lb), last menstrual period 06/07/2013. GENERAL: Well-developed, well-nourished female in no acute distress.  HEENT:  Normocephalic HEART: normal rate  RESP: normal effort ABDOMEN: Soft, non-tender, DT FH not heard EXTREMITIES: Nontender, no edema NEURO: Alert and oriented  LAB RESULTS Results for orders placed during the hospital encounter of 08/16/13 (from the past 24 hour(s))  URINALYSIS, ROUTINE W REFLEX MICROSCOPIC     Status: Abnormal   Collection Time    08/16/13  5:40 PM      Result Value Range   Color, Urine YELLOW  YELLOW   APPearance CLEAR  CLEAR   Specific Gravity, Urine >1.030 (*) 1.005 - 1.030   pH 6.0  5.0 - 8.0   Glucose, UA NEGATIVE  NEGATIVE mg/dL   Hgb urine dipstick NEGATIVE  NEGATIVE   Bilirubin Urine NEGATIVE  NEGATIVE   Ketones, ur 40 (*) NEGATIVE mg/dL   Protein, ur NEGATIVE  NEGATIVE mg/dL   Urobilinogen, UA 0.2  0.0 - 1.0 mg/dL   Nitrite NEGATIVE  NEGATIVE   Leukocytes, UA NEGATIVE  NEGATIVE    IMAGING Bedside US: FHR normal  MAU COURSE IV LR 1000 bolus with piggyback Zofran 8 mg> Feeling better and retaining juice.  ASSESSMENT 1. Nausea and vomiting in pregnancy prior to [redacted] weeks gestation   2. Ptyalism   3. Mild dehydration   G5P1031 at [redacted]w[redacted]d  PLAN Discharge home after IV infused.    Medication List    TAKE these medications       Doxylamine-Pyridoxine 10-10 MG Tbec  Commonly known as:  DICLEGIS  Take 2 tablets by mouth at bedtime.     glycopyrrolate 2 MG tablet  Commonly known as:  ROBINUL-FORTE  Take 1 tablet (2 mg total) by mouth 3 (three) times daily.     prenatal multivitamin Tabs tablet  Take 1 tablet by mouth daily at 12 noon.      ASK your doctor about these medications       ondansetron 8 MG tablet  Commonly known as:  ZOFRAN  Take by mouth every 8 (eight) hours as needed for nausea.       Follow-up Information   Follow up with [facility] (Keep your scheduled prenatal appointment)    Contact information:   [address] [place] [address] [phone]       Danae Orleans, CNM 08/16/2013  7:11 PM

## 2013-08-16 NOTE — MAU Note (Signed)
Really hasn't kept anything down all weekend, urine is getting dark. Called dr and was told to come in.

## 2013-08-29 LAB — OB RESULTS CONSOLE HIV ANTIBODY (ROUTINE TESTING): HIV: NONREACTIVE

## 2013-08-29 LAB — OB RESULTS CONSOLE RUBELLA ANTIBODY, IGM: Rubella: IMMUNE

## 2013-08-29 LAB — OB RESULTS CONSOLE RPR: RPR: NONREACTIVE

## 2013-11-03 NOTE — L&D Delivery Note (Signed)
Delivery Note At 7:28 PM a viable and healthy female was delivered via Vaginal, Spontaneous Delivery (Presentation: Right Occiput Posterior).  APGAR: 8, 9; weight pending.   Placenta status: Intact, Spontaneous.  Cord: 3 vessels  Anesthesia: Epidural  Episiotomy: None Lacerations: 2nd degree Suture Repair: 3.0 vicryl 4-0 vicryl Est. Blood Loss (mL): 250 cc  Mom to postpartum.  Baby to Couplet care / Skin to Skin.  Natasha Duke 03/02/2014, 8:02 PM

## 2014-02-27 LAB — OB RESULTS CONSOLE GBS: GBS: POSITIVE

## 2014-02-28 ENCOUNTER — Inpatient Hospital Stay (HOSPITAL_COMMUNITY)
Admission: AD | Admit: 2014-02-28 | Discharge: 2014-02-28 | Disposition: A | Discharge status: Home / Self Care | Payer: BC Managed Care – PPO | Source: Ambulatory Visit | Attending: Obstetrics and Gynecology | Admitting: Obstetrics and Gynecology

## 2014-02-28 ENCOUNTER — Encounter (HOSPITAL_COMMUNITY): Payer: Self-pay | Admitting: *Deleted

## 2014-02-28 DIAGNOSIS — O133 Gestational [pregnancy-induced] hypertension without significant proteinuria, third trimester: Secondary | ICD-10-CM

## 2014-02-28 DIAGNOSIS — O139 Gestational [pregnancy-induced] hypertension without significant proteinuria, unspecified trimester: Secondary | ICD-10-CM

## 2014-02-28 DIAGNOSIS — O479 False labor, unspecified: Secondary | ICD-10-CM | POA: Insufficient documentation

## 2014-02-28 LAB — URIC ACID: Uric Acid, Serum: 4.5 mg/dL (ref 2.4–7.0)

## 2014-02-28 LAB — COMPREHENSIVE METABOLIC PANEL WITH GFR
ALT: 8 U/L (ref 0–35)
AST: 17 U/L (ref 0–37)
Albumin: 2.9 g/dL — ABNORMAL LOW (ref 3.5–5.2)
Alkaline Phosphatase: 117 U/L (ref 39–117)
BUN: 11 mg/dL (ref 6–23)
CO2: 21 meq/L (ref 19–32)
Calcium: 9.5 mg/dL (ref 8.4–10.5)
Chloride: 102 meq/L (ref 96–112)
Creatinine, Ser: 0.5 mg/dL (ref 0.50–1.10)
GFR calc Af Amer: >90 mL/min
GFR calc non Af Amer: >90 mL/min
Glucose, Bld: 73 mg/dL (ref 70–99)
Potassium: 4.1 meq/L (ref 3.7–5.3)
Sodium: 138 meq/L (ref 137–147)
Total Bilirubin: 0.2 mg/dL — ABNORMAL LOW (ref 0.3–1.2)
Total Protein: 6.8 g/dL (ref 6.0–8.3)

## 2014-02-28 LAB — URINALYSIS, ROUTINE W REFLEX MICROSCOPIC
Bilirubin Urine: NEGATIVE
Glucose, UA: NEGATIVE mg/dL
HGB URINE DIPSTICK: NEGATIVE
Ketones, ur: NEGATIVE mg/dL
Leukocytes, UA: NEGATIVE
Nitrite: NEGATIVE
PROTEIN: NEGATIVE mg/dL
Specific Gravity, Urine: >1.03 — ABNORMAL HIGH (ref 1.005–1.030)
UROBILINOGEN UA: 0.2 mg/dL (ref 0.0–1.0)
pH: 6 (ref 5.0–8.0)

## 2014-02-28 LAB — PROTEIN / CREATININE RATIO, URINE
Creatinine, Urine: 113.45 mg/dL
Protein Creatinine Ratio: 0.1 (ref 0.00–0.15)
Total Protein, Urine: 11.5 mg/dL

## 2014-02-28 LAB — CBC
HCT: 33.8 % — ABNORMAL LOW (ref 36.0–46.0)
Hemoglobin: 11.5 g/dL — ABNORMAL LOW (ref 12.0–15.0)
MCH: 27.7 pg (ref 26.0–34.0)
MCHC: 34 g/dL (ref 30.0–36.0)
MCV: 81.4 fL (ref 78.0–100.0)
Platelets: 167 K/uL (ref 150–400)
RBC: 4.15 MIL/uL (ref 3.87–5.11)
RDW: 14.2 % (ref 11.5–15.5)
WBC: 10.2 K/uL (ref 4.0–10.5)

## 2014-02-28 LAB — LACTATE DEHYDROGENASE: LDH: 176 U/L (ref 94–250)

## 2014-02-28 NOTE — MAU Note (Signed)
Pt sent from office for evaluation of elevated bp

## 2014-02-28 NOTE — Progress Notes (Signed)
Pt removed from serial bp.

## 2014-02-28 NOTE — MAU Provider Note (Signed)
History     CSN: [id number]  Arrival date and time: 02/28/14 1534   First Provider Initiated Contact with Patient 02/28/14 1647      Chief Complaint  Patient presents with  . Hypertension   HPI Ms. Natasha Duke is a 28 y.o. [phone] at [id number] who presents to MAU today from the office for further evaluation of elevated BP. The patient denies history of HTN or pre-eclampsia. She denies headache, blurred vision, floaters, LE edema or RUQ pain. The patient states mild pressure in the lower abdomina. She is having occasional braxton-hicks contractions. She reports good fetal movement.   OB History   Grav Para Term Preterm Abortions TAB SAB Ect Mult Living   5 1 1  0 3 0 3 0 0 1      Past Medical History  Diagnosis Date  . No pertinent past medical history   . Medical history non-contributory     Past Surgical History  Procedure Laterality Date  . Wisdom tooth extraction    . No past surgeries      Family History  Problem Relation Age of Onset  . Hypertension Mother   . Diabetes Mother   . Hypertension Father   . Hypertension Maternal Grandmother   . Hypertension Paternal Grandfather     History  Substance Use Topics  . Smoking status: Never Smoker   . Smokeless tobacco: Never Used  . Alcohol Use: No    Allergies: No Known Allergies  No prescriptions prior to admission    Review of Systems  Constitutional: Negative for fever and malaise/fatigue.  Eyes: Negative for blurred vision and double vision.  Gastrointestinal: Positive for nausea. Negative for vomiting, abdominal pain, diarrhea and constipation.  Genitourinary: Negative for dysuria, urgency and frequency.       No vaginal bleeding, discharge, LOF  Neurological: Negative for headaches.   Physical Exam   Blood pressure 132/89, pulse 113, temperature 98.6 F (37 C), temperature source Oral, resp. rate 20, height 5\' 2"  (1.575 m), weight 162 lb (73.483 kg), last menstrual period 06/07/2013, SpO2  96.00%.  Physical Exam  Constitutional: She is oriented to person, place, and time. She appears well-developed and well-nourished. No distress.  HENT:  Head: Normocephalic and atraumatic.  Cardiovascular: Normal rate.   Respiratory: Effort normal.  GI: Soft. She exhibits no distension and no mass. There is no tenderness. There is no rebound and no guarding.  Musculoskeletal: She exhibits no edema.  Neurological: She is alert and oriented to person, place, and time. She has normal reflexes.  No clonus  Skin: Skin is warm and dry. No erythema.  Psychiatric: She has a normal mood and affect.   Results for orders placed during the hospital encounter of 02/28/14 (from the past 24 hour(s))  URINALYSIS, ROUTINE W REFLEX MICROSCOPIC     Status: Abnormal   Collection Time    02/28/14  4:00 PM      Result Value Ref Range   Color, Urine YELLOW  YELLOW   APPearance CLEAR  CLEAR   Specific Gravity, Urine >1.030 (*) 1.005 - 1.030   pH 6.0  5.0 - 8.0   Glucose, UA NEGATIVE  NEGATIVE mg/dL   Hgb urine dipstick NEGATIVE  NEGATIVE   Bilirubin Urine NEGATIVE  NEGATIVE   Ketones, ur NEGATIVE  NEGATIVE mg/dL   Protein, ur NEGATIVE  NEGATIVE mg/dL   Urobilinogen, UA 0.2  0.0 - 1.0 mg/dL   Nitrite NEGATIVE  NEGATIVE   Leukocytes, UA NEGATIVE  NEGATIVE  PROTEIN / CREATININE RATIO, URINE     Status: None   Collection Time    02/28/14  4:00 PM      Result Value Ref Range   Creatinine, Urine 113.45     Total Protein, Urine 11.5     PROTEIN CREATININE RATIO 0.10  0.00 - 0.15  CBC     Status: Abnormal   Collection Time    02/28/14  4:05 PM      Result Value Ref Range   WBC 10.2  4.0 - 10.5 K/uL   RBC 4.15  3.87 - 5.11 MIL/uL   Hemoglobin 11.5 (*) 12.0 - 15.0 g/dL   HCT 96.0 (*) 45.4 - 09.8 %   MCV 81.4  78.0 - 100.0 fL   MCH 27.7  26.0 - 34.0 pg   MCHC 34.0  30.0 - 36.0 g/dL   RDW 11.9  14.7 - 82.9 %   Platelets 167  150 - 400 K/uL  COMPREHENSIVE METABOLIC PANEL     Status: Abnormal    Collection Time    02/28/14  4:05 PM      Result Value Ref Range   Sodium 138  137 - 147 mEq/L   Potassium 4.1  3.7 - 5.3 mEq/L   Chloride 102  96 - 112 mEq/L   CO2 21  19 - 32 mEq/L   Glucose, Bld 73  70 - 99 mg/dL   BUN 11  6 - 23 mg/dL   Creatinine, Ser 5.62  0.50 - 1.10 mg/dL   Calcium 9.5  8.4 - 13.0 mg/dL   Total Protein 6.8  6.0 - 8.3 g/dL   Albumin 2.9 (*) 3.5 - 5.2 g/dL   AST 17  0 - 37 U/L   ALT 8  0 - 35 U/L   Alkaline Phosphatase 117  39 - 117 U/L   Total Bilirubin 0.2 (*) 0.3 - 1.2 mg/dL   GFR calc non Af Amer >90  >90 mL/min   GFR calc Af Amer >90  >90 mL/min  URIC ACID     Status: None   Collection Time    02/28/14  4:05 PM      Result Value Ref Range   Uric Acid, Serum 4.5  2.4 - 7.0 mg/dL  LACTATE DEHYDROGENASE     Status: None   Collection Time    02/28/14  4:05 PM      Result Value Ref Range   LDH 176  94 - 250 U/L   Patient Vitals for the past 24 hrs:  BP Temp Temp src Pulse Resp SpO2 Height Weight  02/28/14 1827 - - - 113 - 96 % - -  02/28/14 1727 132/89 mmHg - - 118 - - - -  02/28/14 1713 138/90 mmHg - - 108 - - - -  02/28/14 1658 139/92 mmHg - - 111 - - - -  02/28/14 1643 142/95 mmHg - - 113 - - - -  02/28/14 1628 141/99 mmHg - - 119 - - - -  02/28/14 1620 144/90 mmHg - - 113 - - - -  02/28/14 1559 152/104 mmHg 98.6 F (37 C) Oral 109 20 - 5\' 2"  (1.575 m) 162 lb (73.483 kg)    MAU Course  Procedures None  MDM Discussed with patient VS and lab results with Dr. Henderson Cloud. Add prot/creatinine ratio to labs Discussed results with Dr. Henderson Cloud. Patient may be discharged with precautions. Schedule induction for Thursday AM Confirmed induction with L&D.   Assessment and Plan  A: GHTN  P: Discharge home Pre-eclampsia precuations discussed Patient informed of induction scheduled for 03/02/14 at 7:30 am Patient may return to MAU as needed or if her condition were to change or worsen  Freddi StarrJulie N Ethier, PA-C  02/28/2014, 9:12 PM

## 2014-02-28 NOTE — MAU Note (Signed)
Sent from office for eval.. BP was elevated, +protein in urine.  Denies headache, visual changes, epigastric pain or change in swelling.

## 2014-02-28 NOTE — Discharge Instructions (Signed)
Hypertension During Pregnancy Hypertension is also called high blood pressure. It can occur at any time in life and during pregnancy. When you have hypertension, there is extra pressure inside your blood vessels that carry blood from the heart to the rest of your body (arteries). Hypertension during pregnancy can cause problems for you and your baby. Your baby might not weigh as much as it should at birth or might be born early (premature). Very bad cases of hypertension during pregnancy can be life threatening.  Different types of hypertension can occur during pregnancy.   Chronic hypertension. This happens when a woman has hypertension before pregnancy and it continues during pregnancy.  Gestational hypertension. This is when hypertension develops during pregnancy.  Preeclampsia or toxemia of pregnancy. This is a very serious type of hypertension that develops only during pregnancy. It is a disease that affects the whole body (systemic) and can be very dangerous for both mother and baby.  Gestational hypertension and preeclampsia usually go away after your baby is born. Blood pressure generally stabilizes within 6 weeks. Women who have hypertension during pregnancy have a greater chance of developing hypertension later in life or with future pregnancies. RISK FACTORS Some factors make you more likely to develop hypertension during pregnancy. Risk factors include:  Having hypertension before pregnancy.  Having hypertension during a previous pregnancy.  Being overweight.  Being older than 40.  Being pregnant with more than one baby (multiples).  Having diabetes or kidney problems. SIGNS AND SYMPTOMS Chronic and gestational hypertension rarely cause symptoms. Preeclampsia has symptoms, which may include:  Increased protein in your urine. Your health care provider will check for this at every prenatal visit.  Swelling of your hands and face.  Rapid weight gain.  Headaches.  Visual  changes.  Being bothered by light.  Abdominal pain, especially in the right upper area.  Chest pain.  Shortness of breath.  Increased reflexes.  Seizures. Seizures occur with a more severe form of preeclampsia, called eclampsia. DIAGNOSIS   You may be diagnosed with hypertension during a regular prenatal exam. At each visit, tests may include:  Blood pressure checks.  A urine test to check for protein in your urine.  The type of hypertension you are diagnosed with depends on when you developed it. It also depends on your specific blood pressure reading.  Developing hypertension before 20 weeks of pregnancy is consistent with chronic hypertension.  Developing hypertension after 20 weeks of pregnancy is consistent with gestational hypertension.  Hypertension with increased urinary protein is diagnosed as preeclampsia.  Blood pressure measurements that stay above 160 systolic or 110 diastolic are a sign of severe preeclampsia. TREATMENT Treatment for hypertension during pregnancy varies. Treatment depends on the type of hypertension and how serious it is.  If you take medicine for chronic hypertension, you may need to switch medicines.  Drugs called ACE inhibitors should not be taken during pregnancy.  Low-dose aspirin may be suggested for women who have risk factors for preeclampsia.  If you have gestational hypertension, you may need to take a blood pressure medicine that is safe during pregnancy. Your health care provider will recommend the appropriate medicine.  If you have severe preeclampsia, you may need to be in the hospital. Health care providers will watch you and the baby very closely. You also may need to take medicine (magnesium sulfate) to prevent seizures and lower blood pressure.  Sometimes an early delivery is needed. This may be the case if the condition worsens. It would   be done to protect you and the baby. The only cure for preeclampsia is delivery. HOME  CARE INSTRUCTIONS  Schedule and keep all of your regular appointments for prenatal care.  Only take over-the-counter or prescription medicines as directed by your health care provider. Tell your health care provider about all medicines you take.  Eat as little salt as possible.  Get regular exercise.  Do not drink alcohol.  Do not use tobacco products.  Do not drink products with caffeine.  Lie on your left side when resting. SEEK IMMEDIATE MEDICAL CARE IF:  You have severe abdominal pain.  You have sudden swelling in the hands, ankles, or face.  You gain 4 pounds (1.8 kg) or more in 1 week.  You vomit repeatedly.  You have vaginal bleeding.  You do not feel the baby moving as much.  You have a headache.  You have blurred or double vision.  You have muscle twitching or spasms.  You have shortness of breath.  You have blue fingernails and lips.  You have blood in your urine. MAKE SURE YOU:  Understand these instructions.  Will watch your condition.  Will get help right away if you are not doing well or get worse. Document Released: 07/08/2011 Document Revised: 08/10/2013 Document Reviewed: 05/19/2013 [employer] Patient Information 2014 [employer].  

## 2014-03-02 ENCOUNTER — Inpatient Hospital Stay (HOSPITAL_COMMUNITY)
Admission: AD | Admit: 2014-03-02 | Discharge: 2014-03-04 | DRG: 775 | Disposition: A | Discharge status: Home / Self Care | Payer: BC Managed Care – PPO | Source: Ambulatory Visit | Attending: Obstetrics and Gynecology | Admitting: Obstetrics and Gynecology

## 2014-03-02 ENCOUNTER — Encounter (HOSPITAL_COMMUNITY): Payer: Self-pay

## 2014-03-02 ENCOUNTER — Inpatient Hospital Stay (HOSPITAL_COMMUNITY)
Admission: AD | Admit: 2014-03-02 | Payer: BC Managed Care – PPO | Source: Ambulatory Visit | Admitting: Obstetrics and Gynecology

## 2014-03-02 ENCOUNTER — Inpatient Hospital Stay (HOSPITAL_COMMUNITY): Payer: BC Managed Care – PPO | Admitting: Anesthesiology

## 2014-03-02 ENCOUNTER — Encounter (HOSPITAL_COMMUNITY): Payer: BC Managed Care – PPO | Admitting: Anesthesiology

## 2014-03-02 VITALS — BP 119/79 | HR 84 | Temp 97.6°F | Resp 18 | Ht 63.0 in | Wt 163.0 lb

## 2014-03-02 DIAGNOSIS — O139 Gestational [pregnancy-induced] hypertension without significant proteinuria, unspecified trimester: Principal | ICD-10-CM | POA: Diagnosis present

## 2014-03-02 DIAGNOSIS — D649 Anemia, unspecified: Secondary | ICD-10-CM | POA: Diagnosis present

## 2014-03-02 DIAGNOSIS — O99892 Other specified diseases and conditions complicating childbirth: Secondary | ICD-10-CM | POA: Diagnosis present

## 2014-03-02 DIAGNOSIS — O9902 Anemia complicating childbirth: Secondary | ICD-10-CM | POA: Diagnosis present

## 2014-03-02 DIAGNOSIS — Z833 Family history of diabetes mellitus: Secondary | ICD-10-CM

## 2014-03-02 DIAGNOSIS — Z22330 Carrier of Group B streptococcus: Secondary | ICD-10-CM

## 2014-03-02 DIAGNOSIS — O133 Gestational [pregnancy-induced] hypertension without significant proteinuria, third trimester: Secondary | ICD-10-CM | POA: Diagnosis present

## 2014-03-02 DIAGNOSIS — Z8249 Family history of ischemic heart disease and other diseases of the circulatory system: Secondary | ICD-10-CM

## 2014-03-02 DIAGNOSIS — O9989 Other specified diseases and conditions complicating pregnancy, childbirth and the puerperium: Secondary | ICD-10-CM

## 2014-03-02 LAB — CBC
HCT: 31.3 % — ABNORMAL LOW (ref 36.0–46.0)
Hemoglobin: 10.6 g/dL — ABNORMAL LOW (ref 12.0–15.0)
MCH: 27.6 pg (ref 26.0–34.0)
MCHC: 33.9 g/dL (ref 30.0–36.0)
MCV: 81.5 fL (ref 78.0–100.0)
PLATELETS: 144 10*3/uL — AB (ref 150–400)
RBC: 3.84 MIL/uL — ABNORMAL LOW (ref 3.87–5.11)
RDW: 14.3 % (ref 11.5–15.5)
WBC: 9.3 10*3/uL (ref 4.0–10.5)

## 2014-03-02 LAB — COMPREHENSIVE METABOLIC PANEL
ALT: 8 U/L (ref 0–35)
AST: 16 U/L (ref 0–37)
Albumin: 2.6 g/dL — ABNORMAL LOW (ref 3.5–5.2)
Alkaline Phosphatase: 107 U/L (ref 39–117)
BUN: 8 mg/dL (ref 6–23)
CALCIUM: 8.9 mg/dL (ref 8.4–10.5)
CO2: 20 meq/L (ref 19–32)
CREATININE: 0.47 mg/dL — AB (ref 0.50–1.10)
Chloride: 101 mEq/L (ref 96–112)
GLUCOSE: 114 mg/dL — AB (ref 70–99)
Potassium: 3.9 mEq/L (ref 3.7–5.3)
Sodium: 136 mEq/L — ABNORMAL LOW (ref 137–147)
Total Bilirubin: 0.2 mg/dL — ABNORMAL LOW (ref 0.3–1.2)
Total Protein: 6.1 g/dL (ref 6.0–8.3)

## 2014-03-02 LAB — RPR

## 2014-03-02 LAB — TYPE AND SCREEN
ABO/RH(D): O POS
Antibody Screen: NEGATIVE

## 2014-03-02 LAB — URIC ACID: Uric Acid, Serum: 4.6 mg/dL (ref 2.4–7.0)

## 2014-03-02 LAB — LACTATE DEHYDROGENASE: LDH: 173 U/L (ref 94–250)

## 2014-03-02 MED ORDER — OXYTOCIN BOLUS FROM INFUSION: 500.0000 mL | INTRAVENOUS | Status: DC

## 2014-03-02 MED ORDER — ZOLPIDEM TARTRATE 5 MG PO TABS: 5.0000 mg | ORAL_TABLET | Freq: Every evening | ORAL | Status: DC | PRN

## 2014-03-02 MED ORDER — SENNOSIDES-DOCUSATE SODIUM 8.6-50 MG PO TABS
2.0000 | ORAL_TABLET | ORAL | Status: DC
  Administered 2014-03-02 – 2014-03-04 (×2): 2 via ORAL
  Filled 2014-03-02 (×2): qty 2

## 2014-03-02 MED ORDER — PENICILLIN G POTASSIUM 5000000 UNITS IJ SOLR
5.0000 10*6.[IU] | Freq: Once | INTRAMUSCULAR | Status: AC
  Administered 2014-03-02: 5 10*6.[IU] via INTRAVENOUS
  Filled 2014-03-02: qty 5

## 2014-03-02 MED ORDER — FENTANYL 2.5 MCG/ML BUPIVACAINE 1/10 % EPIDURAL INFUSION (WH - ANES)
14.0000 mL/h | INTRAMUSCULAR | Status: DC | PRN
  Filled 2014-03-02: qty 125

## 2014-03-02 MED ORDER — TERBUTALINE SULFATE 1 MG/ML IJ SOLN: 0.2500 mg | Freq: Once | INTRAMUSCULAR | Status: DC | PRN

## 2014-03-02 MED ORDER — EPHEDRINE 5 MG/ML INJ
10.0000 mg | INTRAVENOUS | Status: DC | PRN
  Filled 2014-03-02: qty 2

## 2014-03-02 MED ORDER — PENICILLIN G POTASSIUM 5000000 UNITS IJ SOLR
2.5000 10*6.[IU] | INTRAVENOUS | Status: DC
  Administered 2014-03-02 (×2): 2.5 10*6.[IU] via INTRAVENOUS
  Filled 2014-03-02 (×5): qty 2.5

## 2014-03-02 MED ORDER — LIDOCAINE HCL (PF) 1 % IJ SOLN
30.0000 mL | INTRAMUSCULAR | Status: DC | PRN
  Filled 2014-03-02: qty 30

## 2014-03-02 MED ORDER — OXYTOCIN 40 UNITS IN LACTATED RINGERS INFUSION - SIMPLE MED
62.5000 mL/h | INTRAVENOUS | Status: DC
  Administered 2014-03-02: 62.5 mL/h via INTRAVENOUS
  Filled 2014-03-02: qty 1000

## 2014-03-02 MED ORDER — LACTATED RINGERS IV SOLN: 500.0000 mL | INTRAVENOUS | Status: DC | PRN

## 2014-03-02 MED ORDER — ONDANSETRON HCL 4 MG/2ML IJ SOLN: 4.0000 mg | Freq: Four times a day (QID) | INTRAMUSCULAR | Status: DC | PRN

## 2014-03-02 MED ORDER — PHENYLEPHRINE 40 MCG/ML (10ML) SYRINGE FOR IV PUSH (FOR BLOOD PRESSURE SUPPORT)
80.0000 ug | PREFILLED_SYRINGE | INTRAVENOUS | Status: DC | PRN
  Filled 2014-03-02: qty 10
  Filled 2014-03-02: qty 2

## 2014-03-02 MED ORDER — BUTORPHANOL TARTRATE 1 MG/ML IJ SOLN: 1.0000 mg | INTRAMUSCULAR | Status: DC | PRN

## 2014-03-02 MED ORDER — PHENYLEPHRINE 40 MCG/ML (10ML) SYRINGE FOR IV PUSH (FOR BLOOD PRESSURE SUPPORT)
80.0000 ug | PREFILLED_SYRINGE | INTRAVENOUS | Status: DC | PRN
  Filled 2014-03-02: qty 2

## 2014-03-02 MED ORDER — FENTANYL 2.5 MCG/ML BUPIVACAINE 1/10 % EPIDURAL INFUSION (WH - ANES): 14.0000 mL/h | INTRAMUSCULAR | Status: DC | PRN

## 2014-03-02 MED ORDER — IBUPROFEN 600 MG PO TABS
600.0000 mg | ORAL_TABLET | Freq: Four times a day (QID) | ORAL | Status: DC
  Administered 2014-03-02 – 2014-03-04 (×6): 600 mg via ORAL
  Filled 2014-03-02 (×6): qty 1

## 2014-03-02 MED ORDER — DIPHENHYDRAMINE HCL 25 MG PO CAPS: 25.0000 mg | ORAL_CAPSULE | Freq: Four times a day (QID) | ORAL | Status: DC | PRN

## 2014-03-02 MED ORDER — IBUPROFEN 600 MG PO TABS: 600.0000 mg | ORAL_TABLET | Freq: Four times a day (QID) | ORAL | Status: DC | PRN

## 2014-03-02 MED ORDER — CITRIC ACID-SODIUM CITRATE 334-500 MG/5ML PO SOLN: 30.0000 mL | ORAL | Status: DC | PRN

## 2014-03-02 MED ORDER — OXYTOCIN 40 UNITS IN LACTATED RINGERS INFUSION - SIMPLE MED
1.0000 m[IU]/min | INTRAVENOUS | Status: DC
  Administered 2014-03-02: 14 m[IU]/min via INTRAVENOUS
  Administered 2014-03-02: 2 m[IU]/min via INTRAVENOUS

## 2014-03-02 MED ORDER — OXYCODONE-ACETAMINOPHEN 5-325 MG PO TABS: 1.0000 | ORAL_TABLET | ORAL | Status: DC | PRN

## 2014-03-02 MED ORDER — LABETALOL HCL 5 MG/ML IV SOLN: 10.0000 mg | INTRAVENOUS | Status: DC | PRN

## 2014-03-02 MED ORDER — ACETAMINOPHEN 325 MG PO TABS: 650.0000 mg | ORAL_TABLET | ORAL | Status: DC | PRN

## 2014-03-02 MED ORDER — ONDANSETRON HCL 4 MG PO TABS: 4.0000 mg | ORAL_TABLET | ORAL | Status: DC | PRN

## 2014-03-02 MED ORDER — LIDOCAINE HCL (PF) 1 % IJ SOLN
INTRAMUSCULAR | Status: DC | PRN
  Administered 2014-03-02 (×2): 4 mL

## 2014-03-02 MED ORDER — ONDANSETRON HCL 4 MG/2ML IJ SOLN: 4.0000 mg | INTRAMUSCULAR | Status: DC | PRN

## 2014-03-02 MED ORDER — LACTATED RINGERS IV SOLN
INTRAVENOUS | Status: DC
  Administered 2014-03-02 (×2): via INTRAVENOUS

## 2014-03-02 MED ORDER — SIMETHICONE 80 MG PO CHEW: 80.0000 mg | CHEWABLE_TABLET | ORAL | Status: DC | PRN

## 2014-03-02 MED ORDER — OXYCODONE-ACETAMINOPHEN 5-325 MG PO TABS
1.0000 | ORAL_TABLET | ORAL | Status: DC | PRN
  Filled 2014-03-02: qty 1

## 2014-03-02 MED ORDER — WITCH HAZEL-GLYCERIN EX PADS
1.0000 "application " | MEDICATED_PAD | CUTANEOUS | Status: DC | PRN
  Administered 2014-03-03: 1 via TOPICAL

## 2014-03-02 MED ORDER — DIBUCAINE 1 % RE OINT
1.0000 "application " | TOPICAL_OINTMENT | RECTAL | Status: DC | PRN
  Administered 2014-03-03: 1 via RECTAL
  Filled 2014-03-02: qty 28

## 2014-03-02 MED ORDER — PRENATAL MULTIVITAMIN CH
1.0000 | ORAL_TABLET | Freq: Every day | ORAL | Status: DC
  Administered 2014-03-03: 1 via ORAL
  Filled 2014-03-02: qty 1

## 2014-03-02 MED ORDER — LACTATED RINGERS IV SOLN: 500.0000 mL | Freq: Once | INTRAVENOUS | Status: DC

## 2014-03-02 MED ORDER — DIPHENHYDRAMINE HCL 50 MG/ML IJ SOLN: 12.5000 mg | INTRAMUSCULAR | Status: DC | PRN

## 2014-03-02 MED ORDER — BENZOCAINE-MENTHOL 20-0.5 % EX AERO
1.0000 "application " | INHALATION_SPRAY | CUTANEOUS | Status: DC | PRN
  Administered 2014-03-02: 1 via TOPICAL
  Filled 2014-03-02: qty 56

## 2014-03-02 MED ORDER — LANOLIN HYDROUS EX OINT: TOPICAL_OINTMENT | CUTANEOUS | Status: DC | PRN

## 2014-03-02 MED ORDER — EPHEDRINE 5 MG/ML INJ
10.0000 mg | INTRAVENOUS | Status: DC | PRN
  Filled 2014-03-02: qty 2
  Filled 2014-03-02: qty 4

## 2014-03-02 MED ORDER — TETANUS-DIPHTH-ACELL PERTUSSIS 5-2.5-18.5 LF-MCG/0.5 IM SUSP
0.5000 mL | Freq: Once | INTRAMUSCULAR | Status: AC
  Administered 2014-03-03: 0.5 mL via INTRAMUSCULAR
  Filled 2014-03-02: qty 0.5

## 2014-03-02 MED ORDER — FENTANYL 2.5 MCG/ML BUPIVACAINE 1/10 % EPIDURAL INFUSION (WH - ANES)
INTRAMUSCULAR | Status: DC | PRN
  Administered 2014-03-02: 14 mL/h via EPIDURAL

## 2014-03-02 NOTE — Anesthesia Preprocedure Evaluation (Addendum)
Anesthesia Evaluation  Patient identified by MRN, date of birth, ID band Patient awake    Reviewed: Allergy & Precautions, H&P , Patient's Chart, lab work & pertinent test results  Airway Mallampati: III TM Distance: >3 FB Neck ROM: Full    Dental no notable dental hx. (+) Teeth Intact   Pulmonary neg pulmonary ROS,  breath sounds clear to auscultation  Pulmonary exam normal       Cardiovascular hypertension, Rhythm:Regular Rate:Normal  Gestational HTN on no Rx   Neuro/Psych    GI/Hepatic Neg liver ROS, GERD-  Medicated and Controlled,  Endo/Other  negative endocrine ROS  Renal/GU negative Renal ROS  negative genitourinary   Musculoskeletal negative musculoskeletal ROS (+)   Abdominal   Peds  Hematology  (+) anemia ,   Anesthesia Other Findings   Reproductive/Obstetrics (+) Pregnancy                          Anesthesia Physical Anesthesia Plan  ASA: II  Anesthesia Plan: Epidural   Post-op Pain Management:    Induction:   Airway Management Planned: Natural Airway  Additional Equipment:   Intra-op Plan:   Post-operative Plan:   Informed Consent: I have reviewed the patients History and Physical, chart, labs and discussed the procedure including the risks, benefits and alternatives for the proposed anesthesia with the patient or authorized representative who has indicated his/her understanding and acceptance.     Plan Discussed with: Anesthesiologist  Anesthesia Plan Comments:         Anesthesia Quick Evaluation

## 2014-03-02 NOTE — Anesthesia Procedure Notes (Signed)
Epidural Patient location during procedure: OB Start time: 03/02/2014 12:01 PM  Staffing Anesthesiologist: Zuria Fosdick A. Performed by: anesthesiologist   Preanesthetic Checklist Completed: patient identified, site marked, surgical consent, pre-op evaluation, timeout performed, IV checked, risks and benefits discussed and monitors and equipment checked  Epidural Patient position: sitting Prep: site prepped and draped and DuraPrep Patient monitoring: continuous pulse ox and blood pressure Approach: midline Location: L4-L5 Injection technique: LOR air  Needle:  Needle type: Tuohy  Needle gauge: 17 G Needle length: 9 cm and 9 Needle insertion depth: 5 cm cm Catheter type: closed end flexible Catheter size: 19 Gauge Catheter at skin depth: 10 cm Test dose: negative and Other  Assessment Events: blood not aspirated, injection not painful, no injection resistance, negative IV test and no paresthesia  Additional Notes Patient identified. Risks and benefits discussed including failed block, incomplete  Pain control, post dural puncture headache, nerve damage, paralysis, blood pressure Changes, nausea, vomiting, reactions to medications-both toxic and allergic and post Partum back pain. All questions were answered. Patient expressed understanding and wished to proceed. Sterile technique was used throughout procedure. Epidural site was Dressed with sterile barrier dressing. No paresthesias, signs of intravascular injection Or signs of intrathecal spread were encountered.  Patient was more comfortable after the epidural was dosed. Please see RN's note for documentation of vital signs and FHR which are stable.

## 2014-03-02 NOTE — H&P (Signed)
Natasha RossettiChristina C Bazile is a 28 y.o. female presenting for IOL for gestational hypertension  28 yo [phone] @ 38+2 presents for IOL for gestational hypertension. Pt has been normotensive during this pregnancy until Tuesday when she was noted to have elevated BPs in the office. BP elevations have persisted and she has now developed a dull headache.  THerefore, the decision was made to proceed with IOL for gestational hypertension.  Pts pregnancy has been uncomplicated to this point. History OB History   Grav Para Term Preterm Abortions TAB SAB Ect Mult Living   5 1 1  0 3 0 3 0 0 1     Past Medical History  Diagnosis Date  . No pertinent past medical history   . Medical history non-contributory    Past Surgical History  Procedure Laterality Date  . Wisdom tooth extraction    . No past surgeries     Family History: family history includes Diabetes in her mother; Hypertension in her father, maternal grandmother, mother, and paternal grandfather. Social History:  reports that she has never smoked. She has never used smokeless tobacco. She reports that she does not drink alcohol or use illicit drugs.   Prenatal Transfer Tool  Maternal Diabetes: No Genetic Screening: Normal Maternal Ultrasounds/Referrals: Normal Fetal Ultrasounds or other Referrals:  None Maternal Substance Abuse:  No Significant Maternal Medications:  None Significant Maternal Lab Results:  None Other Comments:  None  ROS:   Dilation: 3 (in office) Blood pressure 145/82, pulse 99, temperature 98.3 F (36.8 C), temperature source Oral, resp. rate 18, height 5\' 3"  (1.6 m), weight 73.936 kg (163 lb), last menstrual period 06/07/2013. Exam Physical Exam  Prenatal labs: ABO, Rh: --/--/O POS (09/28 1143) Antibody:  Negative Rubella:  Immune RPR: Nonreactive (10/27 0000)  HBsAg:  Negative  HIV: Non-reactive (10/27 0000)  GBS: Positive (04/27 0000)   Assessment/Plan: 1) Admit 2) Pitocin 3) AROM when able 4) Epidural  on request 5) Labetalol  10gm IV q 10 minutes prn BPs 160/100    Ewel Lona H. Cherry Wittwer 03/02/2014, 8:26 AM

## 2014-03-03 LAB — CBC
HCT: 29.4 % — ABNORMAL LOW (ref 36.0–46.0)
HEMOGLOBIN: 9.9 g/dL — AB (ref 12.0–15.0)
MCH: 27.4 pg (ref 26.0–34.0)
MCHC: 33.7 g/dL (ref 30.0–36.0)
MCV: 81.4 fL (ref 78.0–100.0)
Platelets: 144 10*3/uL — ABNORMAL LOW (ref 150–400)
RBC: 3.61 MIL/uL — ABNORMAL LOW (ref 3.87–5.11)
RDW: 14.3 % (ref 11.5–15.5)
WBC: 12.4 10*3/uL — ABNORMAL HIGH (ref 4.0–10.5)

## 2014-03-03 NOTE — Lactation Note (Signed)
This note was copied from the chart of Girl Natasha RodChristina Duke. Lactation Consultation Note  Patient Name: [name] RUEAV'WToday's Date: 03/03/2014 Reason for consult: Follow-up assessment Mom is experienced BF and trying to latch baby in cradle hold. Baby was not obtaining good depth so LC assisted Mom to latch baby in football hold. Baby was able to obtain depth, demonstrated a good rhythmic suck, swallowing motions noted. BF basics reviewed, cluster feeding discussed. Mom denied other questions or concerns. Encouraged to call for assist as needed.   Maternal Data    Feeding Feeding Type: Breast Fed Length of feed: 5 min (off and on)  LATCH Score/Interventions Latch: Grasps breast easily, tongue down, lips flanged, rhythmical sucking.  Audible Swallowing: A few with stimulation  Type of Nipple: Everted at rest and after stimulation  Comfort (Breast/Nipple): Soft / non-tender     Hold (Positioning): Assistance needed to correctly position infant at breast and maintain latch. Intervention(s): Breastfeeding basics reviewed;Support Pillows;Position options;Skin to skin  LATCH Score: 8  Lactation Tools Discussed/Used     Consult Status Consult Status: Follow-up Date: 03/04/14 Follow-up type: In-patient    Kearney HardKathy Ann Burlene Montecalvo 03/03/2014, 2:36 PM

## 2014-03-03 NOTE — Lactation Note (Signed)
This note was copied from the chart of Natasha Anselmo RodChristina Woolworth. Lactation Consultation Note Experienced BF mom of 1st child 14 months. Used NS w/1st. Child. States will call if she needs one, but at this time she is latch well. States BF going well. Noted short shaft nipples. Mom made aware of O/P services, breastfeeding support groups, community resources, and our phone # for post-discharge questions. WH/LC brochure given w/resources, support groups and LC services. Encouraged to call for assistance if needed and to verify proper latch.  Patient Name: Natasha Duke WUJWJ'XToday's Date: 03/03/2014     Maternal Data    Feeding Feeding Type: Breast Fed Length of feed: 25 min  LATCH Score/Interventions Latch: Grasps breast easily, tongue down, lips flanged, rhythmical sucking.  Audible Swallowing: A few with stimulation  Type of Nipple: Everted at rest and after stimulation  Comfort (Breast/Nipple): Soft / non-tender     Hold (Positioning): No assistance needed to correctly position infant at breast.  [facility] Score: 9  Lactation Tools Discussed/Used     Consult Status      Natasha Duke 03/03/2014, 2:26 AM

## 2014-03-03 NOTE — Progress Notes (Signed)
PPD#1 Pt without complaints VSSAF IMP/ doing well Plan/ Routine care

## 2014-03-03 NOTE — Anesthesia Postprocedure Evaluation (Signed)
  Anesthesia Post-op Note  Patient: Natasha Duke  Procedure(s) Performed: * No procedures listed *  Patient Location: Mother/Baby  Anesthesia Type:Epidural  Level of Consciousness: awake  Airway and Oxygen Therapy: Patient Spontanous Breathing  Post-op Pain: none  Post-op Assessment: Patient's Cardiovascular Status Stable, Respiratory Function Stable, RESPIRATORY FUNCTION UNSTABLE, No signs of Nausea or vomiting, Adequate PO intake, Pain level controlled, No headache, No backache, No residual numbness and No residual motor weakness  Post-op Vital Signs: Reviewed and stable  Last Vitals:  Filed Vitals:   03/03/14 0252  BP: 133/86  Pulse: 87  Temp: 37.1 C  Resp: 20    Complications: No apparent anesthesia complications

## 2014-03-04 NOTE — Lactation Note (Signed)
This note was copied from the chart of Girl Anselmo RodChristina Olarte. Lactation Consultation Note: Follow up visit with this experienced BF mom . She has baby latched to breast when I went into room and reports that the baby has been nursing for 20 minutes. Getting sleepy at the breast. Mom reports that she has been nursing well  No questions at present. Reviewed BFSG and OP appointments as resources for support after DC. To call prn  Patient Name: Girl Anselmo RodChristina Deyarmin ZOXWR'UToday's Date: 03/04/2014 Reason for consult: Follow-up assessment   Maternal Data Formula Feeding for Exclusion: No Infant to breast within first hour of birth: Yes Does the patient have breastfeeding experience prior to this delivery?: Yes  Feeding Feeding Type: Breast Fed  LATCH Score/Interventions Latch: Grasps breast easily, tongue down, lips flanged, rhythmical sucking.  Audible Swallowing: A few with stimulation  Type of Nipple: Everted at rest and after stimulation  Comfort (Breast/Nipple): Soft / non-tender     Hold (Positioning): No assistance needed to correctly position infant at breast.  LATCH Score: 9  Lactation Tools Discussed/Used     Consult Status Consult Status: Complete    Pamelia HoitDonna D Lakeishia Truluck 03/04/2014, 8:35 AM

## 2014-03-04 NOTE — Progress Notes (Signed)
PPD#2 Pt without complaints. Ready for discharge.  VSSAF PLAN/ Will discharge.

## 2014-03-04 NOTE — Discharge Summary (Signed)
Obstetric Discharge Summary Reason for Admission: induction of labor and gest HTN Prenatal Procedures: none and ultrasound Intrapartum Procedures: spontaneous vaginal delivery Postpartum Procedures: none Complications-Operative and Postpartum: 2 degree perineal laceration Hemoglobin  Date Value Ref Range Status  03/03/2014 9.9* 12.0 - 15.0 g/dL Final     HCT  Date Value Ref Range Status  03/03/2014 29.4* 36.0 - 46.0 % Final    Physical Exam:  General: alert Lochia: appropriate Uterine Fundus: firm   Discharge Diagnoses: Term Pregnancy-delivered, gest HTN  Discharge Information: Date: 03/04/2014 Activity: pelvic rest Diet: routine Medications: PNV and Ibuprofen Condition: stable Instructions: refer to practice specific booklet Discharge to: home Follow-up Information   Follow up with Almon HerculesOSS,KENDRA H., MD. Schedule an appointment as soon as possible for a visit in 4 weeks.   Specialty:  Obstetrics and Gynecology   Contact information:   [address] [place] [id number] [phone]       Newborn Data: Live born female  Birth Weight: 5 lb 15.6 oz (2710 g) APGAR: 9, 9  Home with mother.  Natasha AlandMark E Kiya Duke 03/04/2014, 7:40 AM

## 2014-09-04 ENCOUNTER — Encounter (HOSPITAL_COMMUNITY): Payer: Self-pay

## 2014-09-26 IMAGING — RF DG HYSTEROGRAM
6 series · 6 of 6 positions shown · IV contrast (omnipaque)
Comparison: none

CLINICAL DATA: Recurrent pregnancy losses.  LMP 06/06/2013.

HYSTEROSALPINGOGRAM
TECHNIQUE: Following cleansing of the cervix and vagina with
Betadine solution, a hysterosalpingogram was performed using a 5-
French hysterosalpingogram catheter and Omnipaque 300 contrast.
The patient tolerated the exam without difficulty.
Fluoroscopy time:  1 minute, 6 seconds

[Series 1: run · 1 of 1 slices shown (1 of 6)]
[im 1/1]
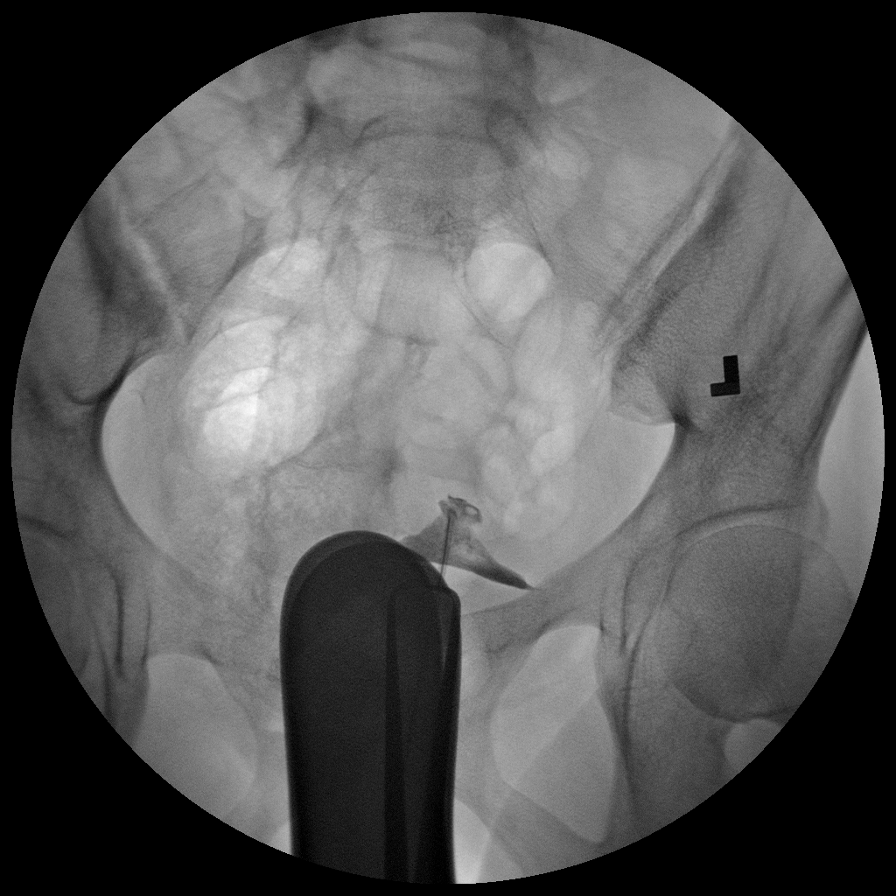

[Series 2: run · 1 of 1 slices shown (2 of 6)]
[im 1/1]
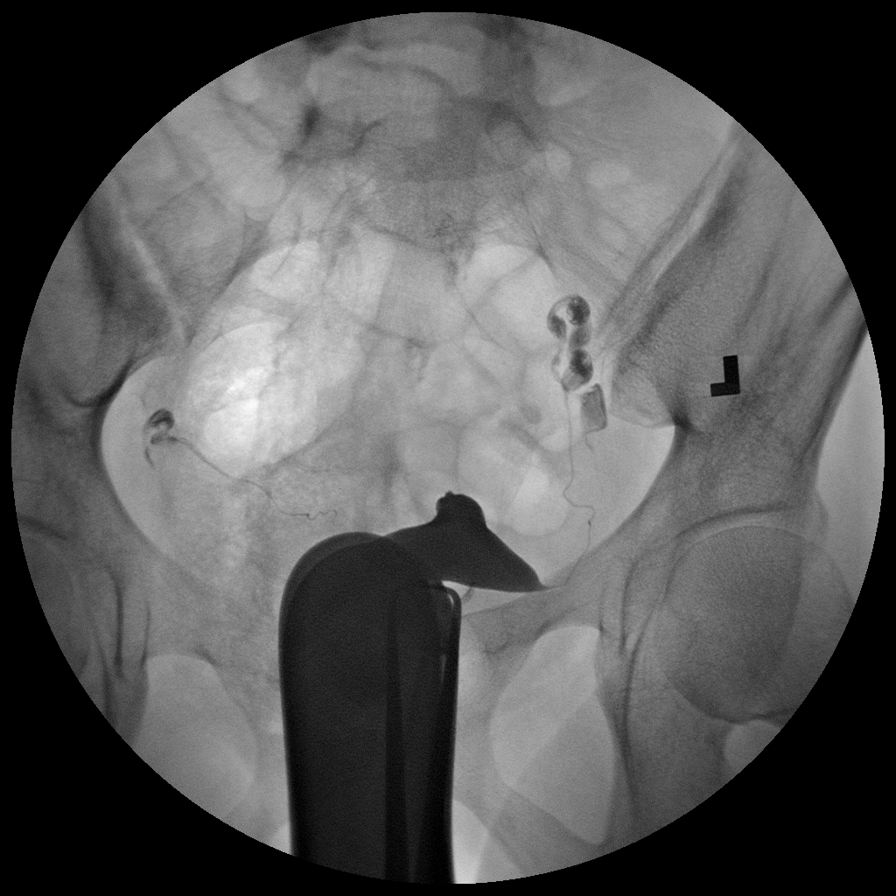

[Series 3: run · 1 of 1 slices shown (3 of 6)]
[im 1/1]
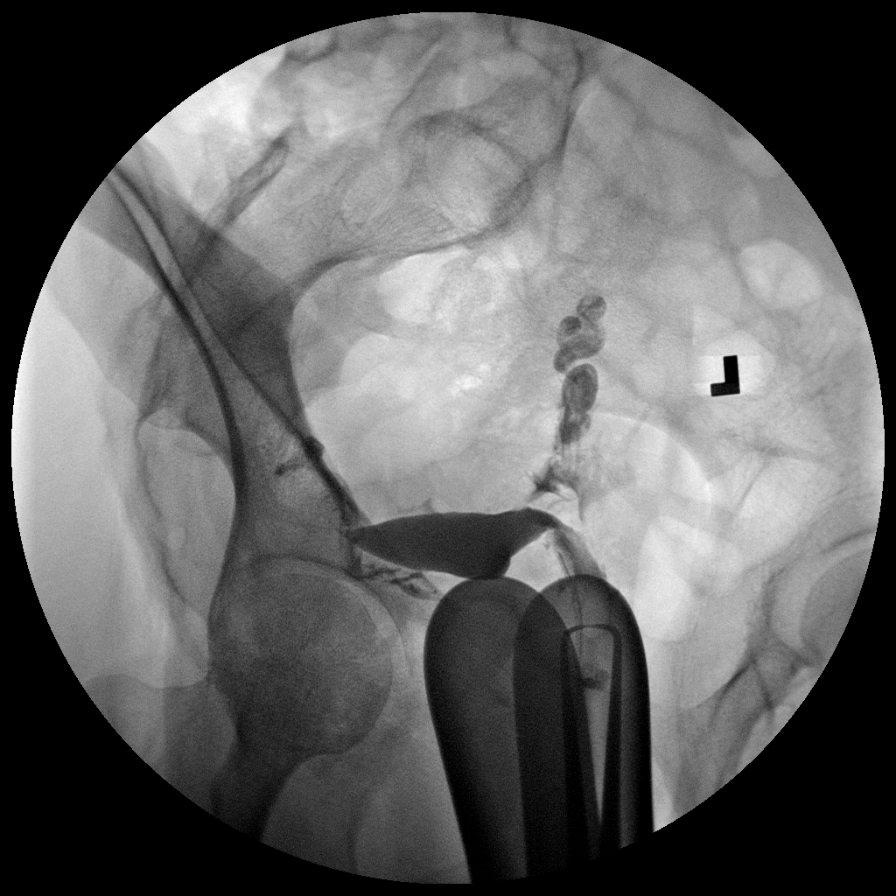

[Series 4: run · 1 of 1 slices shown (4 of 6)]
[im 1/1]
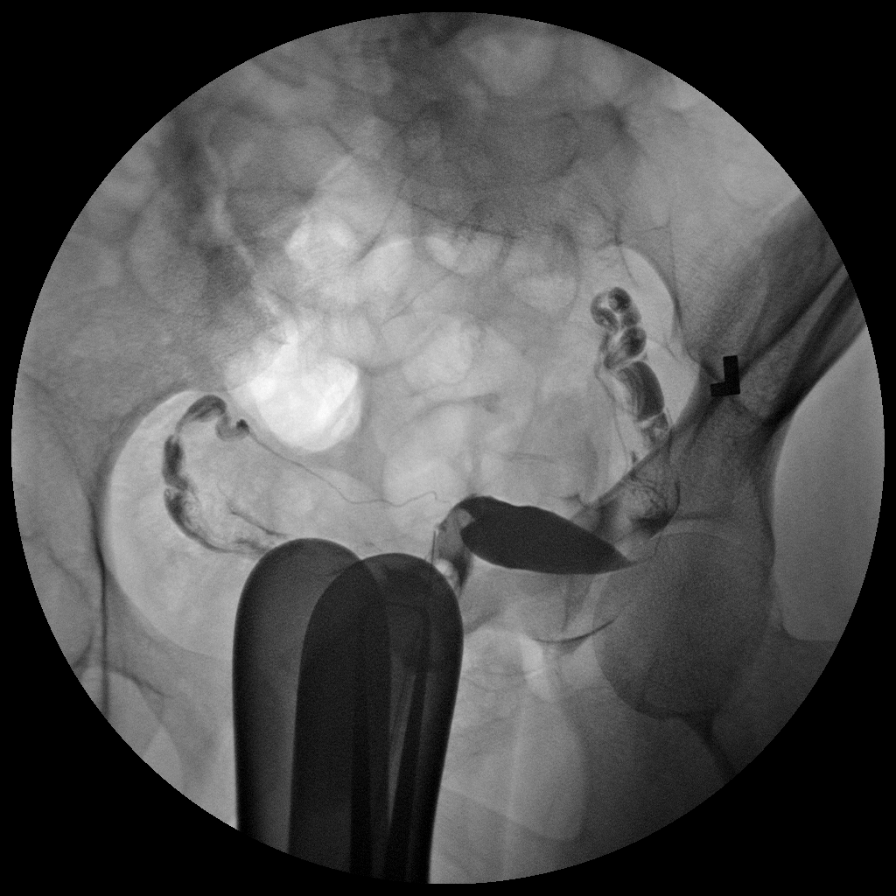

[Series 5: run · 1 of 1 slices shown (5 of 6)]
[im 1/1]
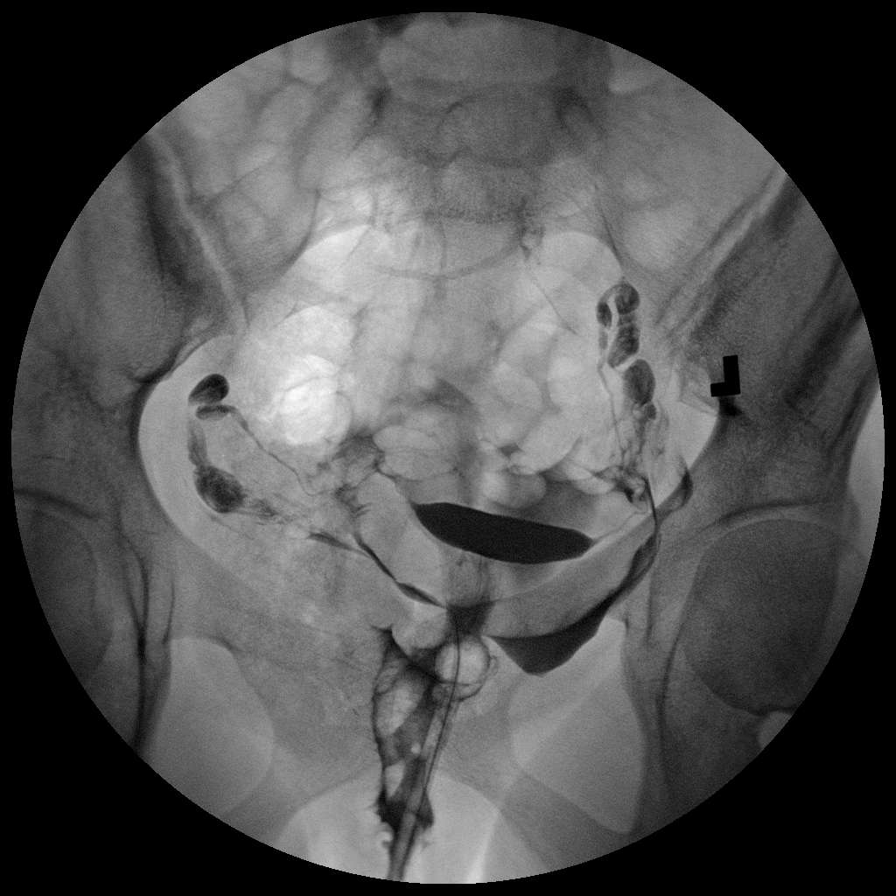

[Series 6: run · 1 of 1 slices shown (6 of 6)]
[im 1/1]
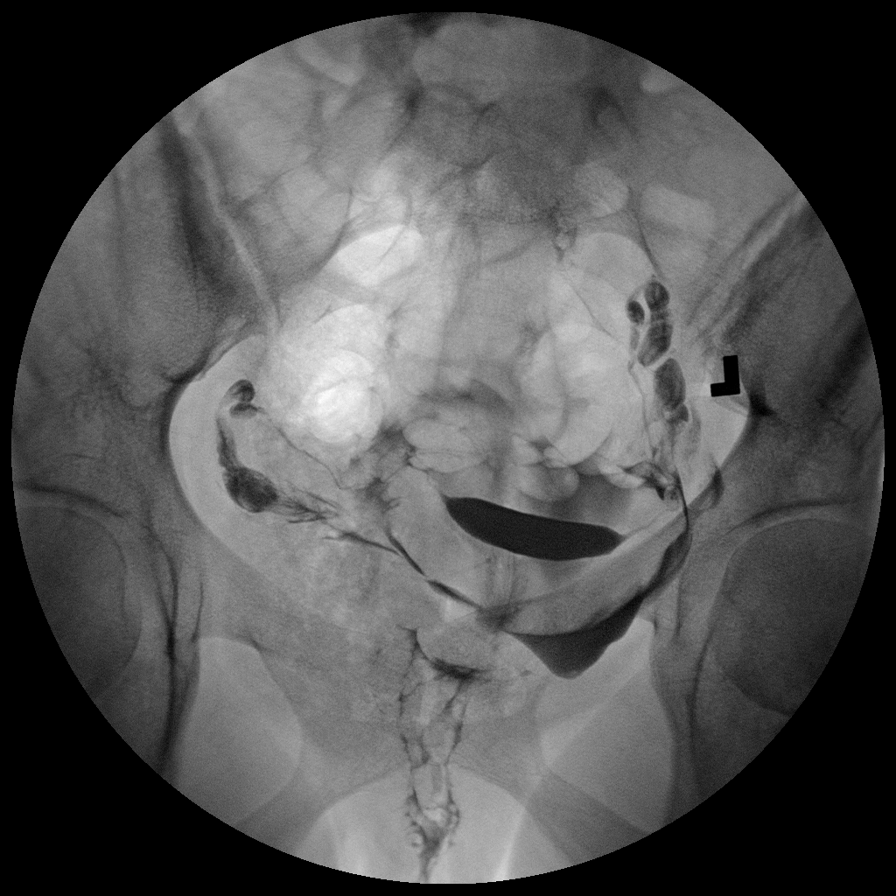

[6 of 6 positions shown; findings below may reference images not displayed]

FINDINGS: The endometrial cavity of the uterus is normal in contour
and appearance. Specifically, the cervical canal has a normal
appearance.

Contrast filling of both fallopian tubes is seen, and both tubes
are normal in appearance.  Intraperitoneal spill of contrast from
both fallopian tubes is demonstrated.  The uterus is retroverted.
IMPRESSION: Normal study.  Fallopian tubes are patent bilaterally.

## 2019-10-30 ENCOUNTER — Encounter: Payer: Self-pay | Admitting: Emergency Medicine

## 2019-10-30 ENCOUNTER — Emergency Department
Admission: EM | Admit: 2019-10-30 | Discharge: 2019-10-30 | Disposition: A | Discharge status: Home / Self Care | Payer: BC Managed Care – PPO | Attending: Emergency Medicine | Admitting: Emergency Medicine

## 2019-10-30 ENCOUNTER — Emergency Department: Payer: BC Managed Care – PPO

## 2019-10-30 ENCOUNTER — Other Ambulatory Visit: Payer: Self-pay

## 2019-10-30 DIAGNOSIS — Y9389 Activity, other specified: Secondary | ICD-10-CM | POA: Insufficient documentation

## 2019-10-30 DIAGNOSIS — Y998 Other external cause status: Secondary | ICD-10-CM | POA: Diagnosis not present

## 2019-10-30 DIAGNOSIS — Y929 Unspecified place or not applicable: Secondary | ICD-10-CM | POA: Diagnosis not present

## 2019-10-30 DIAGNOSIS — Z79899 Other long term (current) drug therapy: Secondary | ICD-10-CM | POA: Diagnosis not present

## 2019-10-30 DIAGNOSIS — S59901A Unspecified injury of right elbow, initial encounter: Secondary | ICD-10-CM | POA: Diagnosis present

## 2019-10-30 DIAGNOSIS — S52044A Nondisplaced fracture of coronoid process of right ulna, initial encounter for closed fracture: Secondary | ICD-10-CM | POA: Insufficient documentation

## 2019-10-30 MED ORDER — MELOXICAM 15 MG PO TABS: 15.0000 mg | ORAL_TABLET | Freq: Every day | ORAL | 0 refills | Status: AC

## 2019-10-30 MED ORDER — HYDROCODONE-ACETAMINOPHEN 5-325 MG PO TABS
1.0000 | ORAL_TABLET | Freq: Once | ORAL | Status: AC
  Administered 2019-10-30: 1 via ORAL
  Filled 2019-10-30: qty 1

## 2019-10-30 MED ORDER — HYDROCODONE-ACETAMINOPHEN 5-325 MG PO TABS: 1.0000 | ORAL_TABLET | ORAL | 0 refills | Status: AC | PRN

## 2019-10-30 NOTE — ED Notes (Signed)
Pt may eat and drink per EDP 

## 2019-10-30 NOTE — ED Triage Notes (Signed)
[address] while trying to ride a [employer].  Right arm pain.

## 2019-10-30 NOTE — ED Notes (Signed)
Pt fell off hoverboard and landed on right hand. Pt c/o pain in right hand and arm and wrist. Pt with swelling in hand, wrist and elbow on right arm. Ice packs applied

## 2019-10-30 NOTE — ED Notes (Signed)
Pt given instructions on splint care. EDP rechecked splint. Pt has some numbness in right fingers but it has not gotten worse with splint application. Capillary refill good. No swelling noted in hand.

## 2019-10-30 NOTE — ED Provider Notes (Signed)
[facility] Emergency Department Provider Note  ____________________________________________  Time seen: Approximately 5:07 PM  I have reviewed the triage vital signs and the nursing notes.   HISTORY  Chief Complaint Fall    HPI Natasha Duke is a 32 y.o. female who presents the emergency department complaining of right elbow pain after a fall.  Patient was riding a however board, lost her balance and fell backwards.  Patient tried to catch herself with outstretched right arm.  Patient states that she felt a pop and a sharp pain radiating from her wrist to her elbow area.  She states that point that her hand became numb.  She is having return and sensation but her arm feels "heavy."  She denies her head or lose consciousness.  Patient's main complaint right now is elbow pain.  No medications prior to arrival.         Past Medical History:  Diagnosis Date  . Medical history non-contributory   . No pertinent past medical history     Patient Active Problem List   Diagnosis Date Noted  . Gestational hypertension w/o significant proteinuria in 3rd trimester 03/02/2014  . Normal delivery 03/02/2014    Past Surgical History:  Procedure Laterality Date  . NO PAST SURGERIES    . WISDOM TOOTH EXTRACTION      Prior to Admission medications   Medication Sig Start Date End Date Taking? Authorizing Provider  HYDROcodone-acetaminophen (NORCO/VICODIN) 5-325 MG tablet Take 1 tablet by mouth every 4 (four) hours as needed for moderate pain. 10/30/19   Jasmina Gendron, Charline Bills, PA-C  meloxicam (MOBIC) 15 MG tablet Take 1 tablet (15 mg total) by mouth daily. 10/30/19   Kazaria Gaertner, Charline Bills, PA-C  Prenatal Vit-Min-FA-Fish Oil ([facility] PRENATAL GUMMY PO) Take 2 tablets by mouth daily.    [provider]    Allergies Patient has no known allergies.  Family History  Problem Relation Age of Onset  . Hypertension Mother   . Diabetes Mother   . Hypertension  Father   . Hypertension Maternal Grandmother   . Hypertension Paternal Grandfather     Social History Social History   Tobacco Use  . Smoking status: Never Smoker  . Smokeless tobacco: Never Used  Substance Use Topics  . Alcohol use: No  . Drug use: No     Review of Systems  Constitutional: No fever/chills Eyes: No visual changes. No discharge ENT: No upper respiratory complaints. Cardiovascular: no chest pain. Respiratory: no cough. No SOB. Gastrointestinal: No abdominal pain.  No nausea, no vomiting.  Musculoskeletal: Positive for right arm/elbow pain/injury Skin: Negative for rash, abrasions, lacerations, ecchymosis. Neurological: Negative for headaches, focal weakness or numbness. 10-point ROS otherwise negative.  ____________________________________________   PHYSICAL EXAM:  VITAL SIGNS: ED Triage Vitals  Enc Vitals Group     BP 10/30/19 1621 (!) 156/98     Pulse Rate 10/30/19 1621 (!) 106     Resp 10/30/19 1621 20     Temp 10/30/19 1621 98 F (36.7 C)     Temp Source 10/30/19 1621 Oral     SpO2 10/30/19 1621 98 %     Weight 10/30/19 1552 162 lb 14.7 oz (73.9 kg)     Height 10/30/19 1621 5\' 3"  (1.6 m)     Head Circumference --      Peak Flow --      Pain Score 10/30/19 1552 8     Pain Loc --      Pain Edu? --  Excl. in GC? --      Constitutional: Alert and oriented. Well appearing and in no acute distress. Eyes: Conjunctivae are normal. PERRL. EOMI. Head: Atraumatic. ENT:      Ears:       Nose: No congestion/rhinnorhea.      Mouth/Throat: Mucous membranes are moist.  Neck: No stridor.    Cardiovascular: Normal rate, regular rhythm. Normal S1 and S2.  Good peripheral circulation. Respiratory: Normal respiratory effort without tachypnea or retractions. Lungs CTAB. Good air entry to the bases with no decreased or absent breath sounds. Musculoskeletal: Full range of motion to all extremities. No gross deformities appreciated.  Visualization of the  right upper extremity feels no gross deformities.  No abrasions, lacerations.  Patient has limited range of motion about the right elbow due to pain.  Examination of the right upper portion of the upper extremity, shoulder is unremarkable.  Patient is tender to palpation diffusely about the elbow, more tenderness to the posterior elbow and over the radial head and other areas.  No palpable abnormality or ballottement in that area.  Patient is diffusely tender to palpation throughout the forearm extending into the distal radius and ulna with no palpable abnormality.  This area is less tender than the elbow.  No tenderness to palpation over the right hand.  Full range of motion all digits right hand.  Sensation and capillary refill intact all digits. Neurologic:  Normal speech and language. No gross focal neurologic deficits are appreciated.  Skin:  Skin is warm, dry and intact. No rash noted. Psychiatric: Mood and affect are normal. Speech and behavior are normal. Patient exhibits appropriate insight and judgement.   ____________________________________________   LABS (all labs ordered are listed, but only abnormal results are displayed)  Labs Reviewed - No data to display ____________________________________________  EKG   ____________________________________________  RADIOLOGY I personally viewed and evaluated these images as part of my medical decision making, as well as reviewing the written report by the radiologist.  DG Elbow Complete Right  Result Date: 10/30/2019 CLINICAL DATA:  33 year old who fell off of a hover board and landed on the RIGHT arm. RIGHT elbow pain. Initial encounter. EXAM: RIGHT ELBOW - COMPLETE 3+ VIEW COMPARISON:  None. FINDINGS: Acute comminuted fracture involving the coronoid process of the ulna, visible only on the LATERAL image. No fractures elsewhere. Well-preserved joint spaces. Well-preserved bone mineral density. IMPRESSION: Acute comminuted fracture  involving the coronoid process of the ulna. No fractures elsewhere. Electronically Signed   By: Hulan Saas M.D.   On: 10/30/2019 18:38   DG Forearm Right  Result Date: 10/30/2019 CLINICAL DATA:  Right forearm pain secondary to a fall today. EXAM: RIGHT FOREARM - 2 VIEW COMPARISON:  None. FINDINGS: There is no evidence of fracture or other focal bone lesions. Soft tissues are unremarkable. IMPRESSION: Normal exam. Electronically Signed   By: Francene Boyers M.D.   On: 10/30/2019 17:13    ____________________________________________    PROCEDURES  Procedure(s) performed:    .Splint Application  Date/Time: 10/30/2019 6:57 PM Performed by: Racheal Patches, PA-C Authorized by: Racheal Patches, PA-C   Consent:    Consent obtained:  Verbal   Consent given by:  Patient   Risks discussed:  Pain, numbness and swelling Pre-procedure details:    Sensation:  Normal Procedure details:    Laterality:  Right   Location:  Elbow   Elbow:  R elbow   Splint type:  Long arm   Supplies:  Cotton padding, Ortho-Glass,  elastic bandage and sling Post-procedure details:    Pain:  Improved   Sensation:  Normal   Patient tolerance of procedure:  Tolerated well, no immediate complications      Medications  HYDROcodone-acetaminophen (NORCO/VICODIN) 5-325 MG per tablet 1 tablet (1 tablet Oral Given 10/30/19 1801)     ____________________________________________   INITIAL IMPRESSION / ASSESSMENT AND PLAN / ED COURSE  Pertinent labs & imaging results that were available during my care of the patient were reviewed by me and considered in my medical decision making (see chart for details).  Review of the [place] CSRS was performed in accordance of the [facility] prior to dispensing any controlled drugs.           Patient's diagnosis is consistent with fracture of the coronoid process right upper extremity.  Patient presented to emergency department after a fall.  Patient try to catch  herself on her outstretched arm.  Patient was having significant pain about the right elbow.  Initial imaging of the forearm revealed no acute findings, however given the patient's pain complaint, limited range of motion and limited imaging of the elbow itself with the forearm imaging, I proceeded with dedicated elbow films.  This was consistent with a comminuted coronoid fracture.  Patient's arm is splinted at this time.  Sling for comfort..  Patient will be prescribed limited pain medication as well as anti-inflammatories for symptom relief.  Follow-up with orthopedist.. Patient is given ED precautions to return to the ED for any worsening or new symptoms.     ____________________________________________  FINAL CLINICAL IMPRESSION(S) / ED DIAGNOSES  Final diagnoses:  Closed nondisplaced fracture of coronoid process of right ulna, initial encounter      NEW MEDICATIONS STARTED DURING THIS VISIT:  ED Discharge Orders         Ordered    HYDROcodone-acetaminophen (NORCO/VICODIN) 5-325 MG tablet  Every 4 hours PRN     10/30/19 1904    meloxicam (MOBIC) 15 MG tablet  Daily     10/30/19 1904              This chart was dictated using voice recognition software/Dragon. Despite best efforts to proofread, errors can occur which can change the meaning. Any change was purely unintentional.    Racheal PatchesCuthriell, Carlean Crowl D, PA-C 10/30/19 1904    Phineas SemenGoodman, [place], MD 10/30/19 [phone]

## 2024-06-15 ENCOUNTER — Other Ambulatory Visit: Payer: Self-pay | Admitting: Obstetrics and Gynecology

## 2024-06-15 DIAGNOSIS — R928 Other abnormal and inconclusive findings on diagnostic imaging of breast: Secondary | ICD-10-CM

## 2024-06-16 ENCOUNTER — Other Ambulatory Visit: Payer: Self-pay | Admitting: Obstetrics and Gynecology

## 2024-06-16 DIAGNOSIS — R928 Other abnormal and inconclusive findings on diagnostic imaging of breast: Secondary | ICD-10-CM

## 2024-06-17 ENCOUNTER — Other Ambulatory Visit: Payer: Self-pay | Admitting: Obstetrics and Gynecology

## 2024-06-17 ENCOUNTER — Ambulatory Visit
Admission: RE | Admit: 2024-06-17 | Discharge: 2024-06-17 | Disposition: A | Discharge status: Home / Self Care | Source: Ambulatory Visit | Attending: Obstetrics and Gynecology | Admitting: Obstetrics and Gynecology

## 2024-06-17 DIAGNOSIS — R921 Mammographic calcification found on diagnostic imaging of breast: Secondary | ICD-10-CM

## 2024-06-17 DIAGNOSIS — R928 Other abnormal and inconclusive findings on diagnostic imaging of breast: Secondary | ICD-10-CM

## 2024-06-22 ENCOUNTER — Other Ambulatory Visit: Payer: Self-pay

## 2024-06-23 ENCOUNTER — Inpatient Hospital Stay
Admission: RE | Admit: 2024-06-23 | Discharge: 2024-06-23 | Discharge status: Home / Self Care | Source: Ambulatory Visit | Attending: Obstetrics and Gynecology | Admitting: Obstetrics and Gynecology

## 2024-06-23 ENCOUNTER — Ambulatory Visit
Admission: RE | Admit: 2024-06-23 | Discharge: 2024-06-23 | Disposition: A | Discharge status: Home / Self Care | Source: Ambulatory Visit | Attending: Obstetrics and Gynecology | Admitting: Obstetrics and Gynecology

## 2024-06-23 DIAGNOSIS — R928 Other abnormal and inconclusive findings on diagnostic imaging of breast: Secondary | ICD-10-CM

## 2024-06-23 DIAGNOSIS — R921 Mammographic calcification found on diagnostic imaging of breast: Secondary | ICD-10-CM

## 2024-06-23 HISTORY — PX: BREAST BIOPSY: SHX20

## 2024-06-24 LAB — SURGICAL PATHOLOGY
